# Patient Record
Sex: Female | Born: 1955 | Race: White | Hispanic: No | Marital: Married | Smoking: Never smoker
Health system: Southern US, Academic
[De-identification: ages and names within clinical notes are randomized; demographics above are authoritative.]

## PROBLEM LIST (undated history)

## (undated) DIAGNOSIS — E785 Hyperlipidemia, unspecified: Secondary | ICD-10-CM

## (undated) DIAGNOSIS — R6889 Other general symptoms and signs: Secondary | ICD-10-CM

## (undated) DIAGNOSIS — Z87898 Personal history of other specified conditions: Secondary | ICD-10-CM

## (undated) DIAGNOSIS — G4730 Sleep apnea, unspecified: Secondary | ICD-10-CM

## (undated) DIAGNOSIS — C801 Malignant (primary) neoplasm, unspecified: Secondary | ICD-10-CM

## (undated) DIAGNOSIS — F32A Depression, unspecified: Secondary | ICD-10-CM

## (undated) DIAGNOSIS — R7303 Prediabetes: Secondary | ICD-10-CM

## (undated) DIAGNOSIS — Z9989 Dependence on other enabling machines and devices: Secondary | ICD-10-CM

## (undated) DIAGNOSIS — G629 Polyneuropathy, unspecified: Secondary | ICD-10-CM

## (undated) DIAGNOSIS — F419 Anxiety disorder, unspecified: Secondary | ICD-10-CM

## (undated) DIAGNOSIS — M8580 Other specified disorders of bone density and structure, unspecified site: Secondary | ICD-10-CM

## (undated) DIAGNOSIS — M539 Dorsopathy, unspecified: Secondary | ICD-10-CM

## (undated) DIAGNOSIS — M797 Fibromyalgia: Secondary | ICD-10-CM

## (undated) DIAGNOSIS — G4700 Insomnia, unspecified: Secondary | ICD-10-CM

## (undated) DIAGNOSIS — R519 Headache, unspecified: Secondary | ICD-10-CM

## (undated) DIAGNOSIS — M1990 Unspecified osteoarthritis, unspecified site: Secondary | ICD-10-CM

## (undated) DIAGNOSIS — K219 Gastro-esophageal reflux disease without esophagitis: Secondary | ICD-10-CM

## (undated) DIAGNOSIS — Z8679 Personal history of other diseases of the circulatory system: Secondary | ICD-10-CM

## (undated) HISTORY — PX: KNEE CARTILAGE SURGERY: SHX688

## (undated) HISTORY — PX: HX SHOULDER SURGERY: 2100001311

## (undated) HISTORY — PX: BREAST CYST EXCISION: SHX579

## (undated) HISTORY — PX: HX TONSILLECTOMY: SHX27

## (undated) HISTORY — PX: REPLACEMENT TOTAL KNEE: SUR1224

## (undated) HISTORY — PX: HX BACK SURGERY: SHX140

## (undated) HISTORY — PX: SKIN CANCER EXCISION: SHX779

---
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
## 1995-11-24 ENCOUNTER — Other Ambulatory Visit (HOSPITAL_COMMUNITY): Payer: Self-pay

## 2020-07-25 IMAGING — CR XRAY SACRUM/COCCYX MIN 2 VIEWS
1 series · 2 of 2 positions shown · non-contrast
Comparison: None available.

﻿EXAM:  XRAY PELVIS 1/2 VIEWS

EXAM: XRAY SACRUM/COCCYX MIN 2 VIEWS
INDICATION: Trauma. Fall.

[Series 1: view not recorded · 0.17mm/px · 2 of 2 slices shown]
[im 1/2]
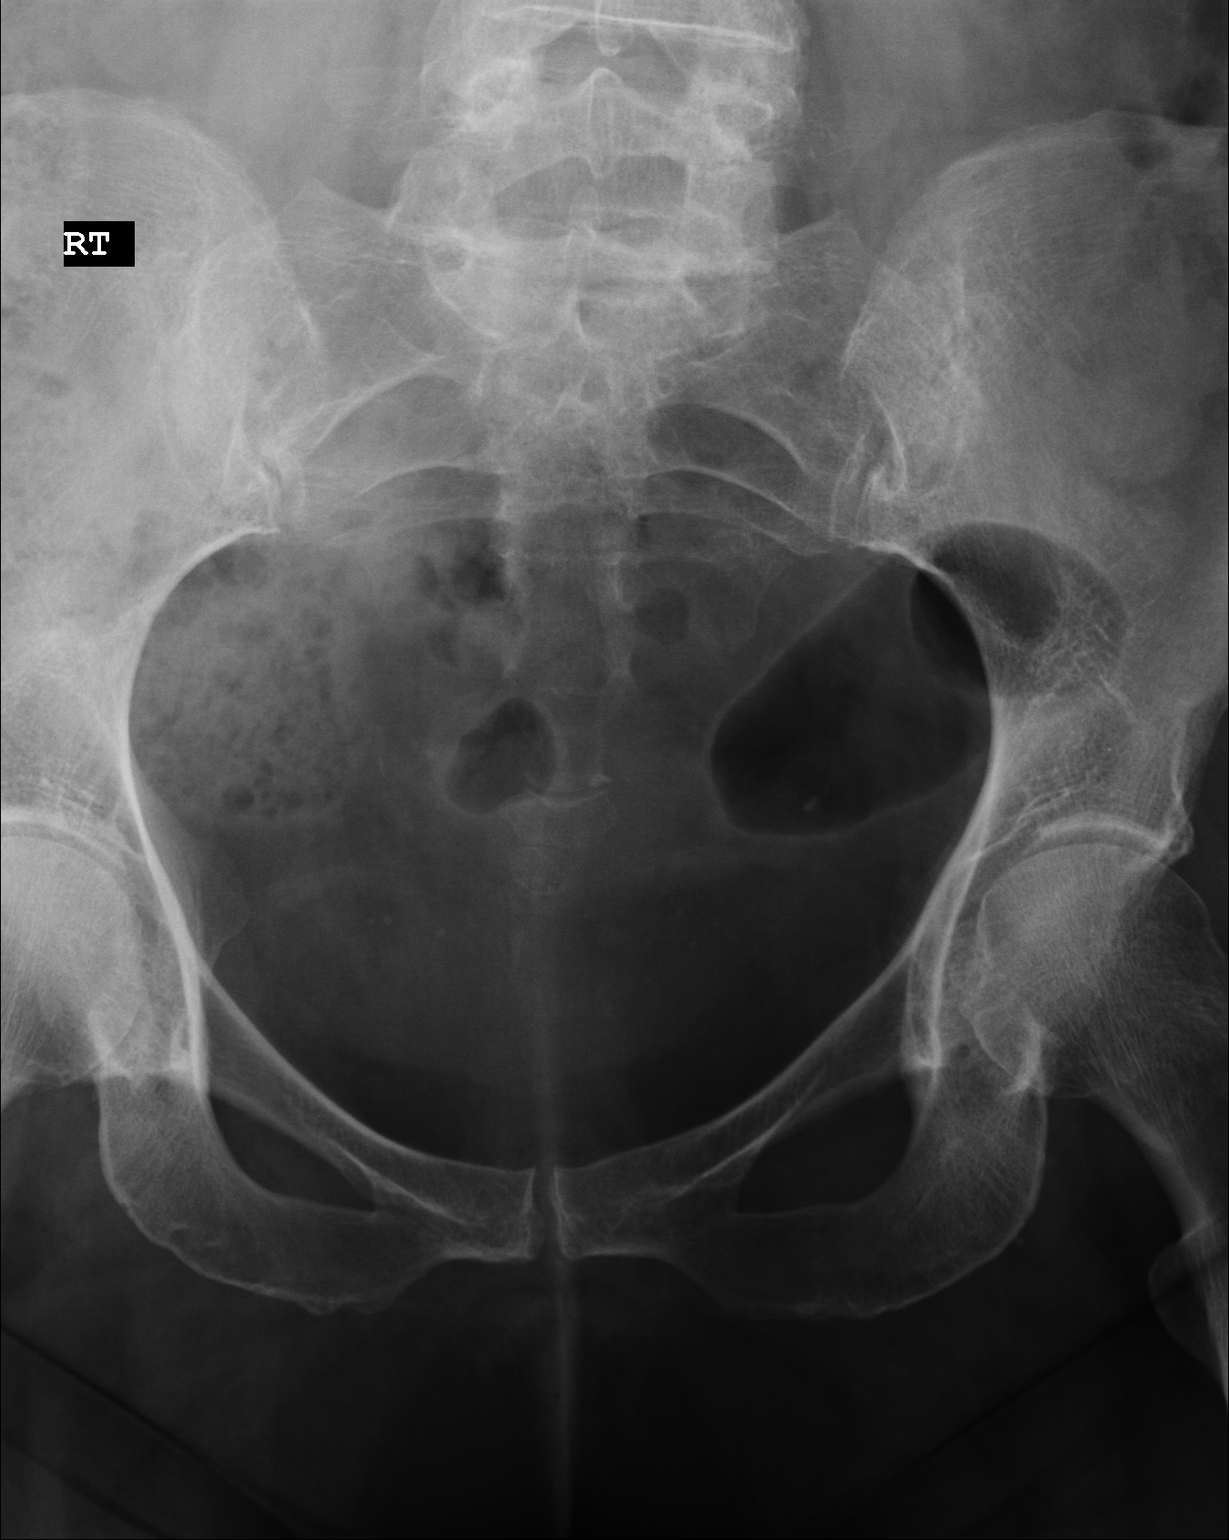
[im 2/2]
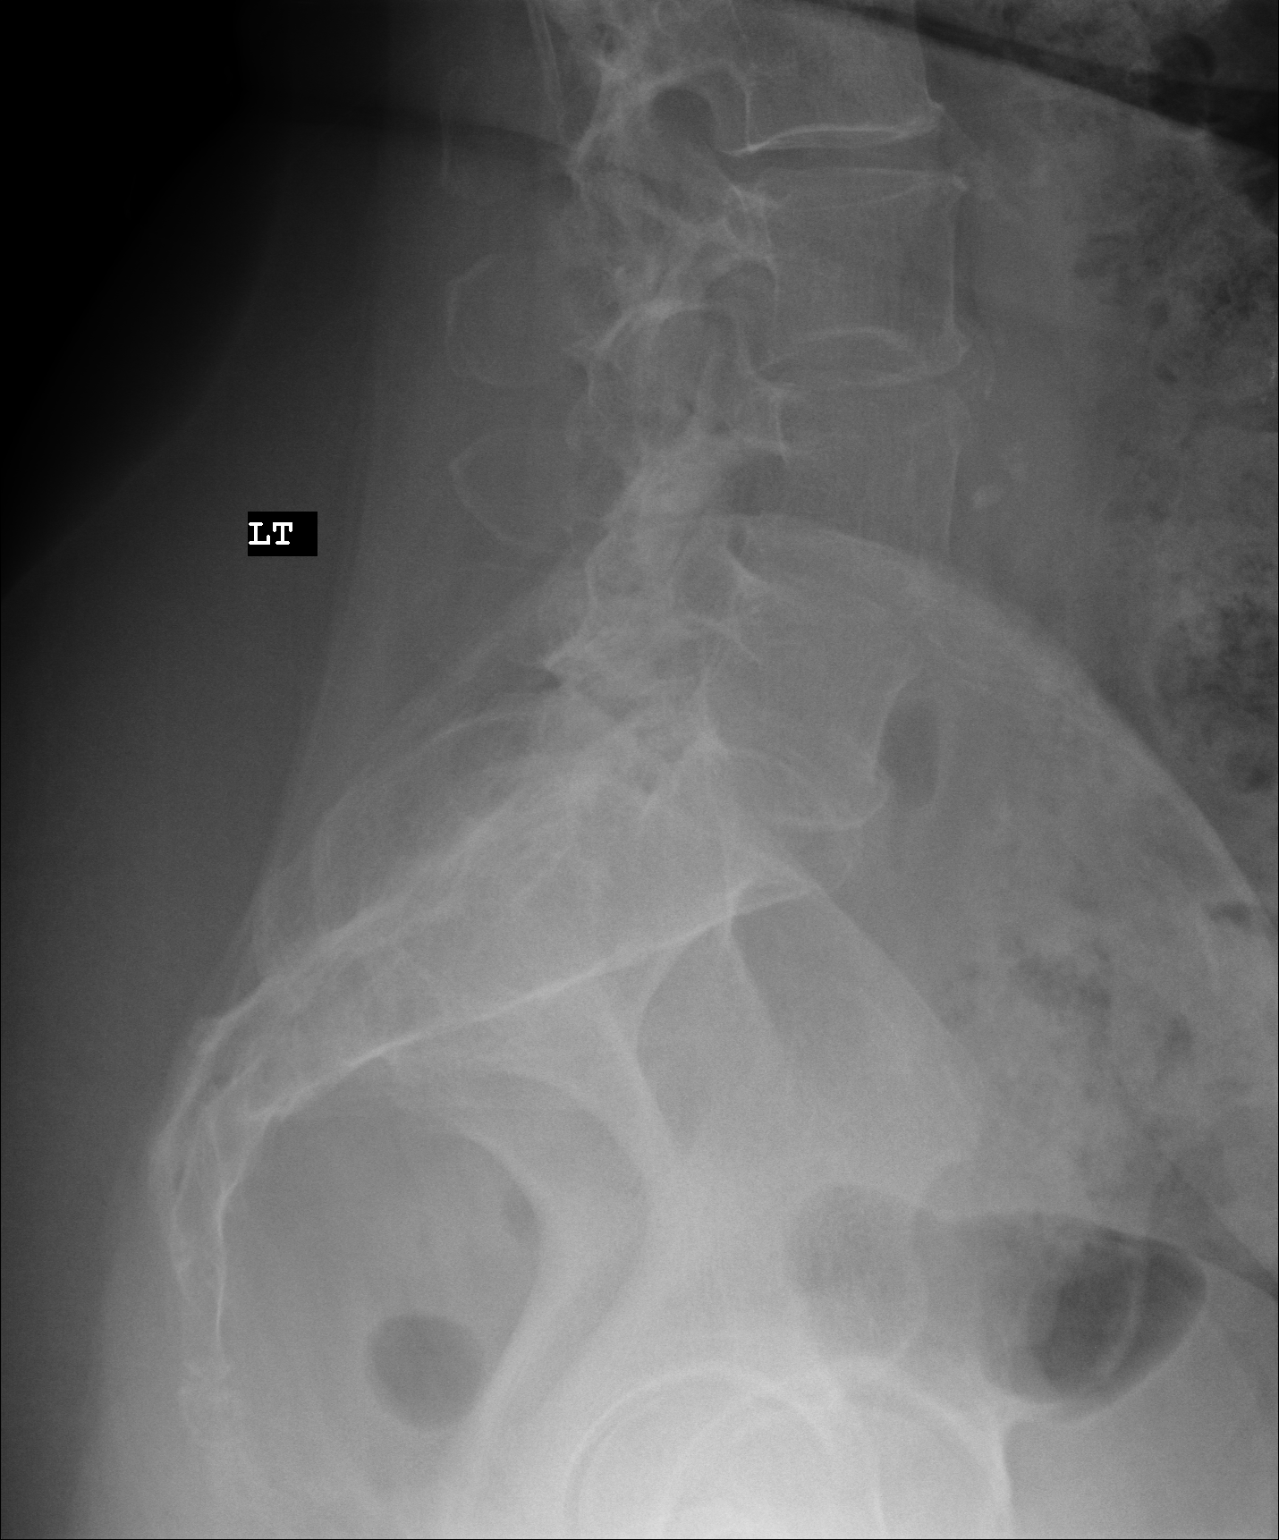

[2 of 2 positions shown; findings below may reference images not displayed]

FINDINGS: There is no acute fracture or subluxation. Hip and sacroiliac joints appear well maintained. No suspicious lesion of the sacrum is seen. Surrounding soft tissues are also unremarkable.
IMPRESSION: Unremarkable exams.

## 2020-07-25 IMAGING — CR XRAY PELVIS [DATE] VIEWS
1 series · 1 of 1 positions shown · non-contrast
Comparison: None available.

﻿EXAM:  XRAY PELVIS 1/2 VIEWS

EXAM: XRAY SACRUM/COCCYX MIN 2 VIEWS
INDICATION: Trauma. Fall.

[view not recorded]
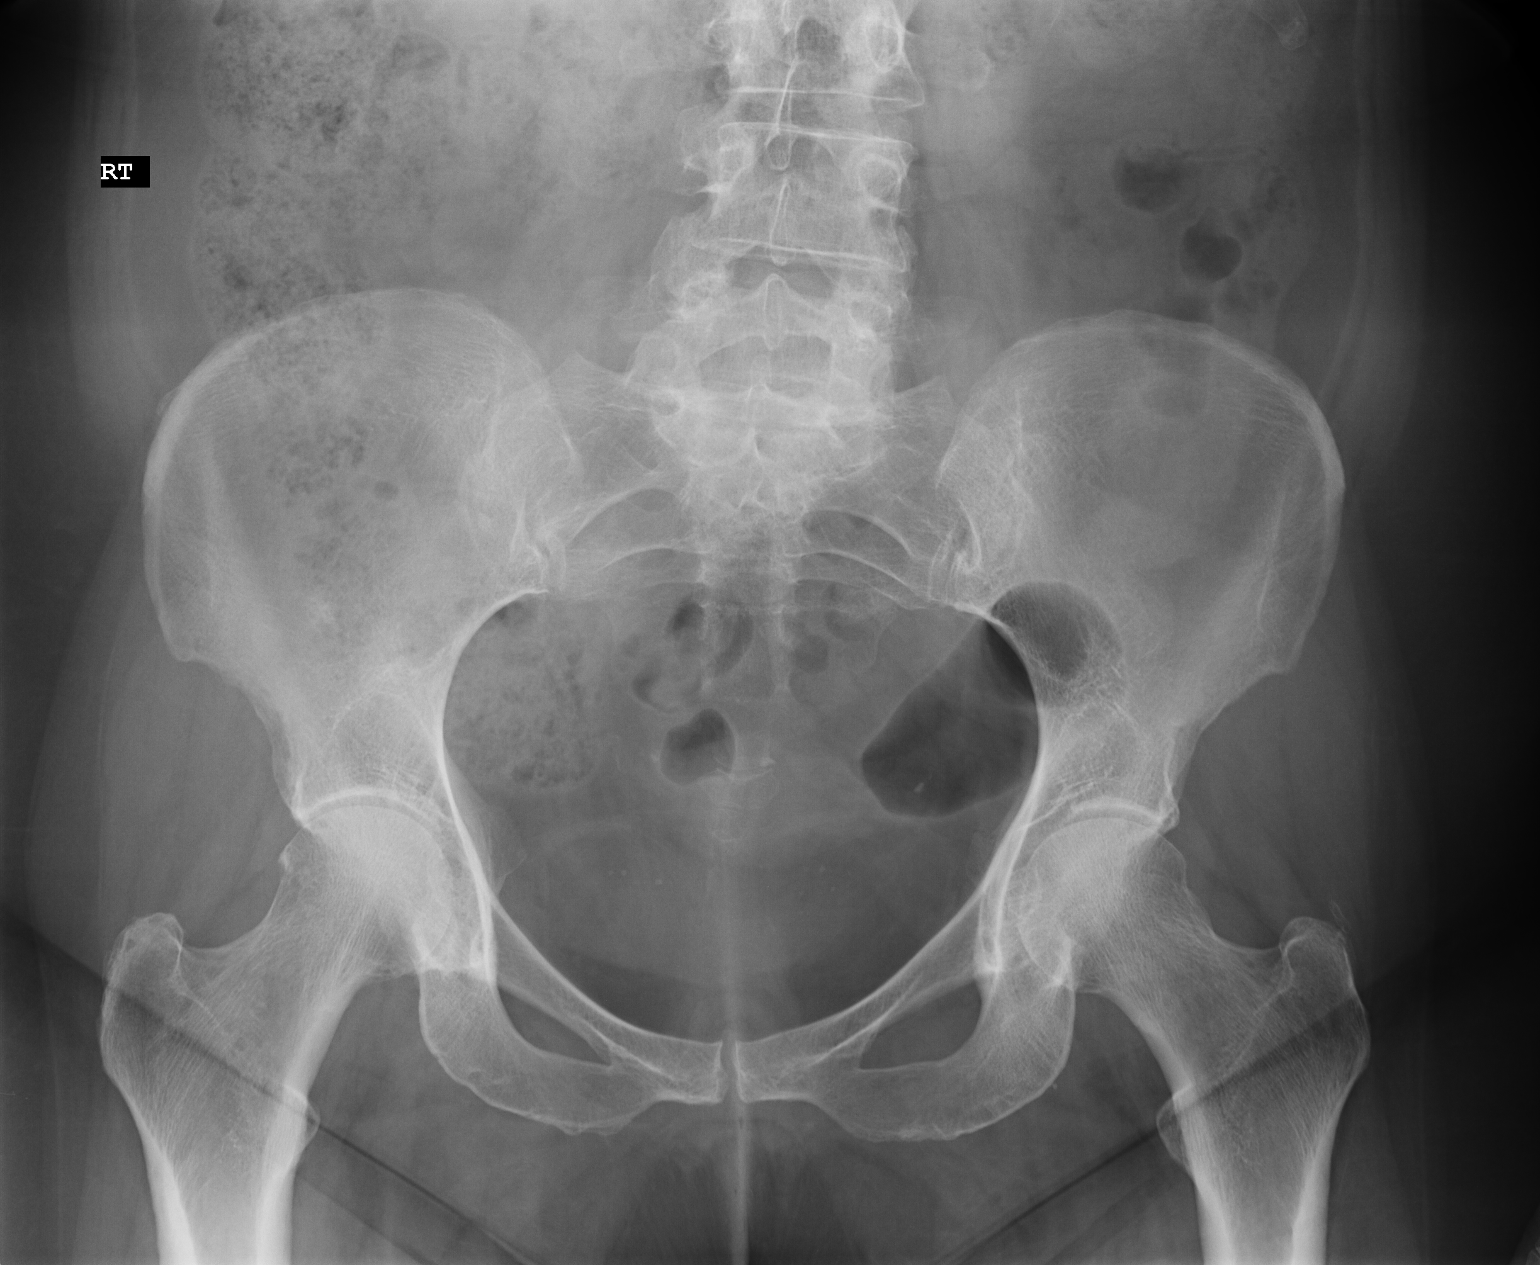

[1 of 1 positions shown; findings below may reference images not displayed]

FINDINGS: There is no acute fracture or subluxation. Hip and sacroiliac joints appear well maintained. No suspicious lesion of the sacrum is seen. Surrounding soft tissues are also unremarkable.
IMPRESSION: Unremarkable exams.

## 2020-07-25 IMAGING — CR XRAY LUMBAR SPINE COMPLETE
1 series · 5 of 5 positions shown · non-contrast
Comparison: None available.

﻿EXAM:  XRAY LUMBAR SPINE COMPLETE
INDICATION: Lower back pain.

[Series 1: view not recorded · 0.17mm/px · 5 of 5 slices shown]
[im 1/5]
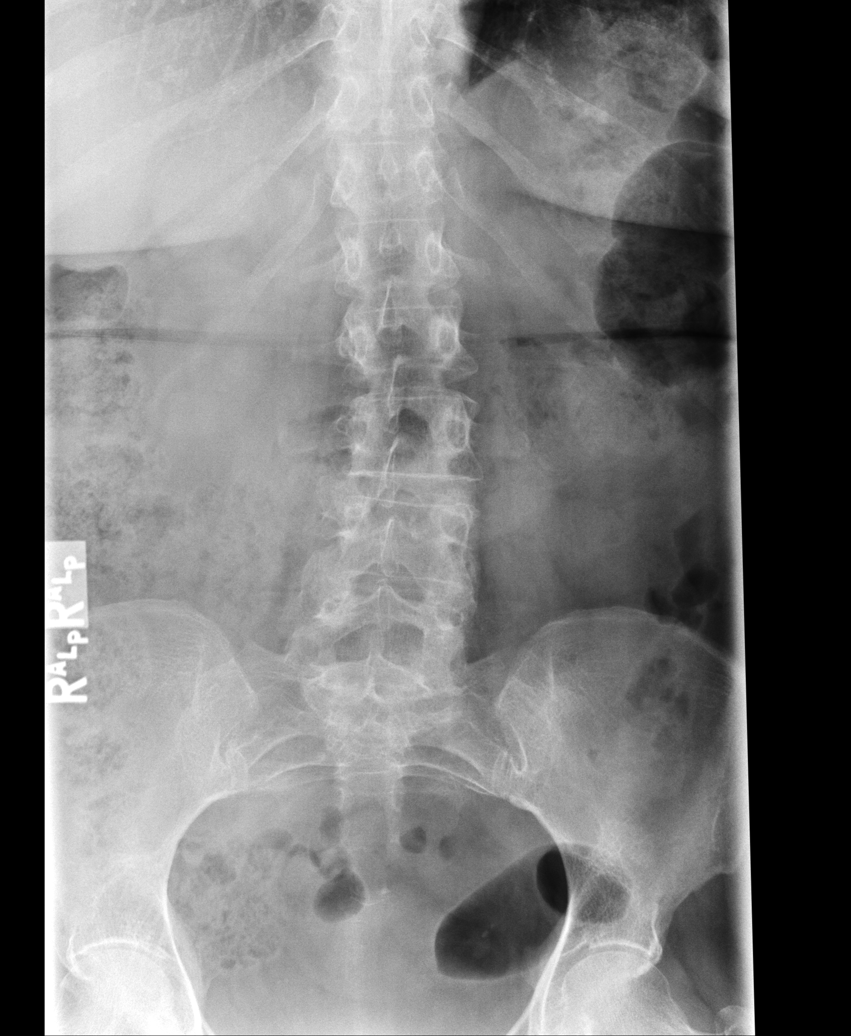
[im 2/5]
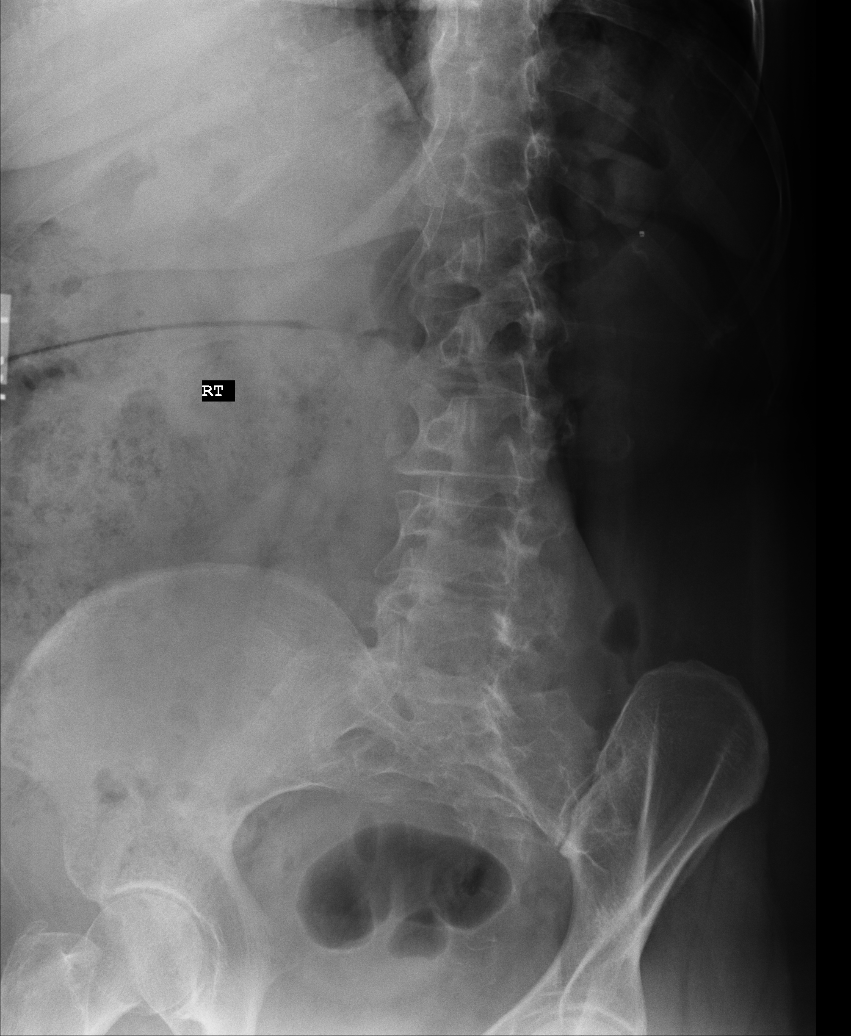
[im 3/5]
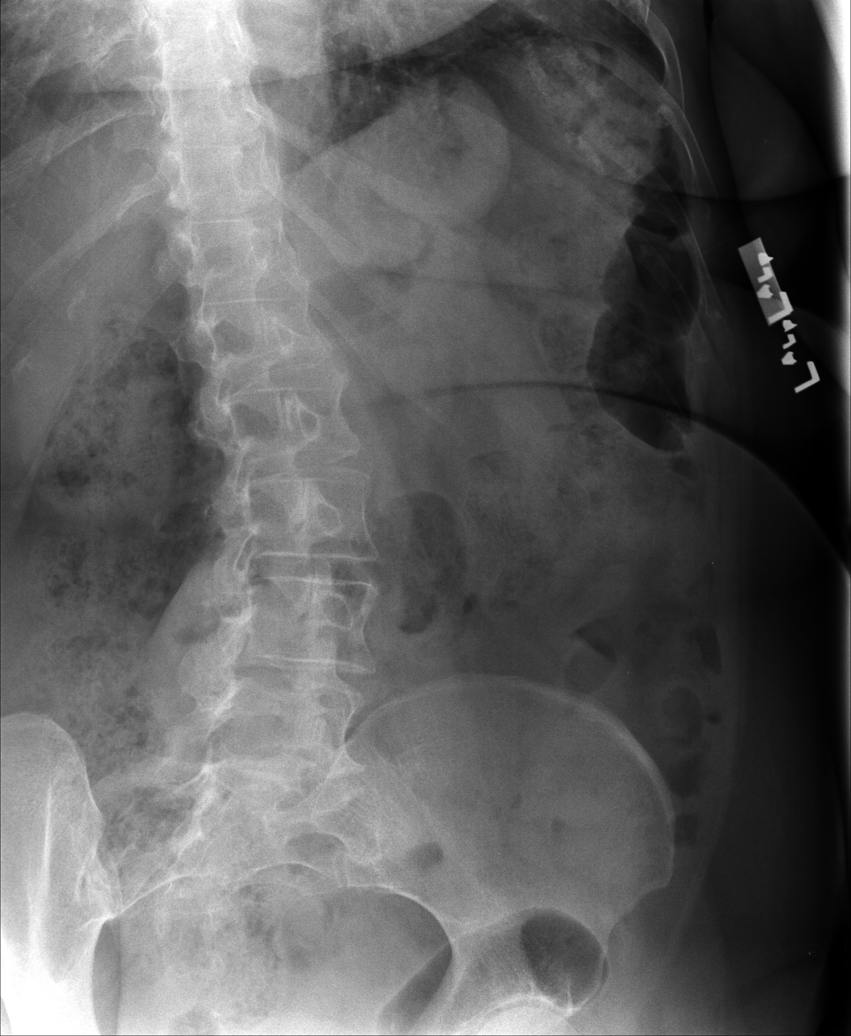
[im 4/5]
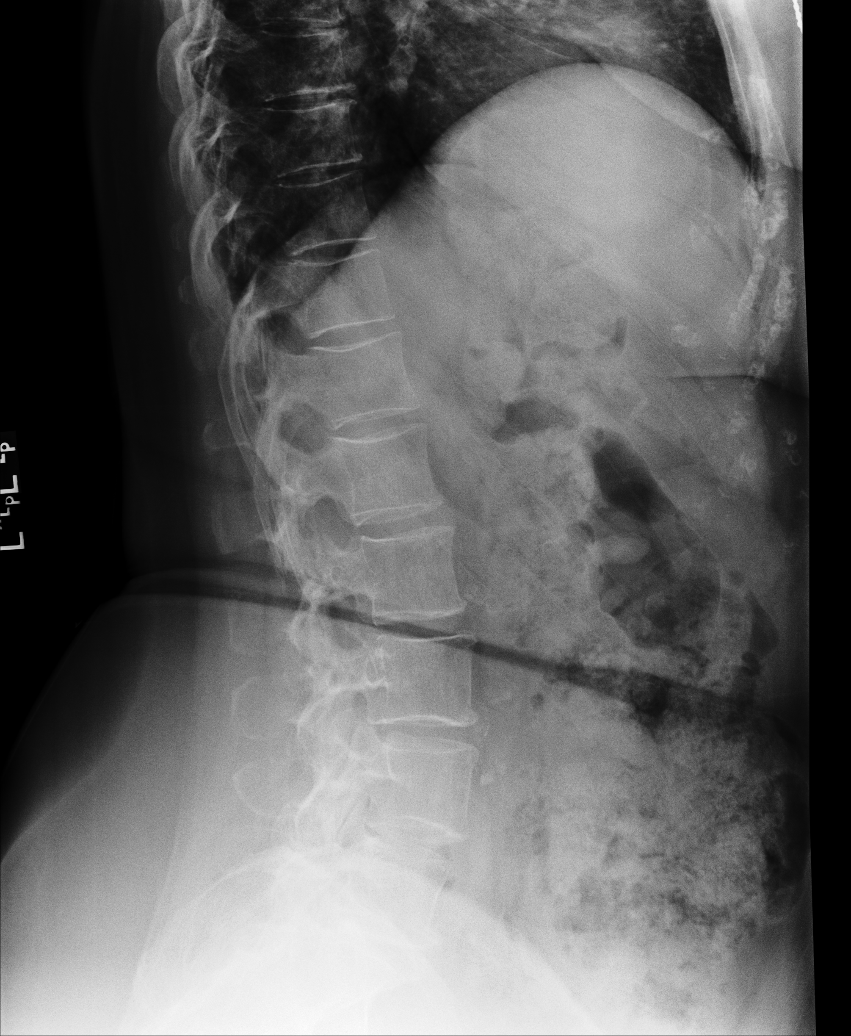
[im 5/5]
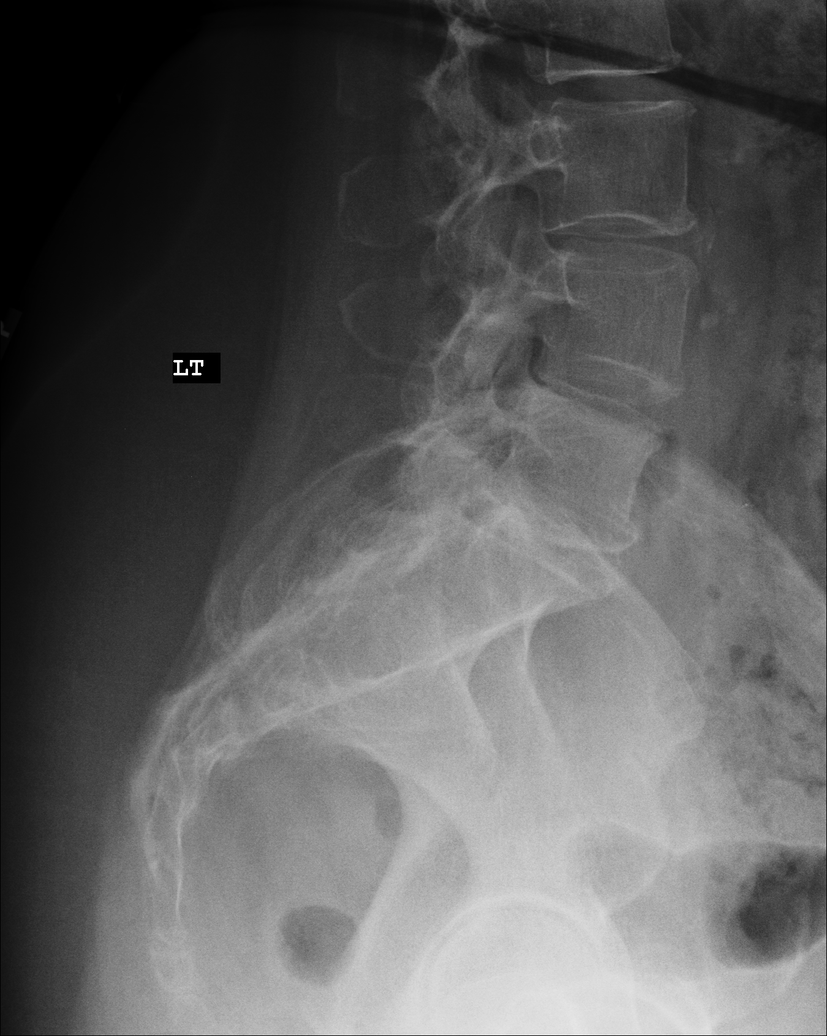

[5 of 5 positions shown; findings below may reference images not displayed]

FINDINGS: Vertebral bodies are normal in height and alignment. There is no acute fracture or subluxation. There is mild degenerative disc disease at L5-S1 level. There is moderate facet arthropathy within the lower lumbar spine. There is no definite pars defect on oblique views. Paraspinal soft tissues are unremarkable. There are minimal vascular calcifications.
IMPRESSION: Degenerative changes as detailed above.

## 2022-01-15 IMAGING — MR MRI CERVICAL SPINE WITHOUT CONTRAST
4 of 5 series · 22 of 48 positions shown · non-contrast
Comparison: No prior imaging studies of cervical spine are available for comparison.

﻿EXAM:  [id number]   MRI CERVICAL SPINE WITHOUT CONTRAST
INDICATION: 65-year-old with persistent neck pain.  No history of cervical spine surgery.
TECHNIQUE: Sagittal, coronal and axial images of the cervical spine were obtained following standard protocol.

[Series 5: T2 · sagittal · 3.0mm · 0.78mm/px · 7 of 11 slices shown (1 of 2)]
[im 1/11]
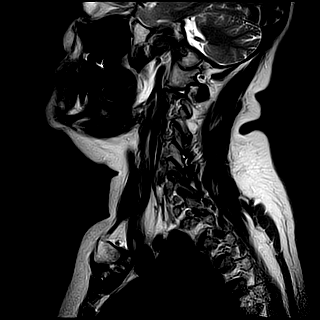
[im 2/11]
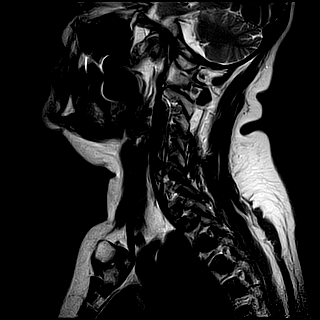
[im 4/11]
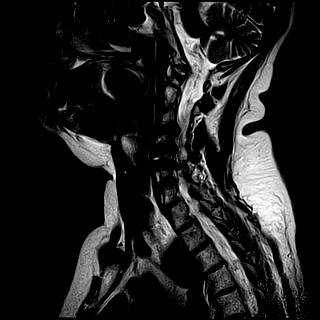
[im 6/11]
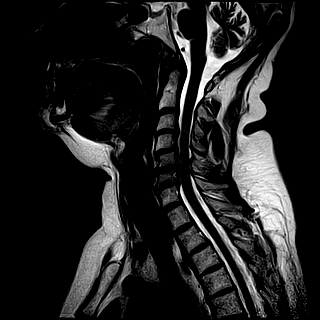
[im 7/11]
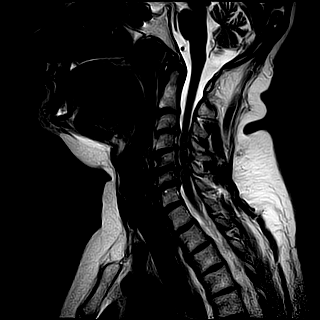
[im 9/11]
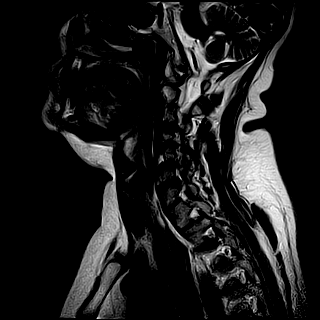
[im 11/11]
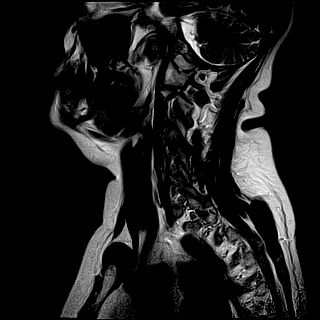

[Series 6: T1 · sagittal · 3.0mm · 0.49mm/px · 3 of 11 slices shown]
[im 2/11]
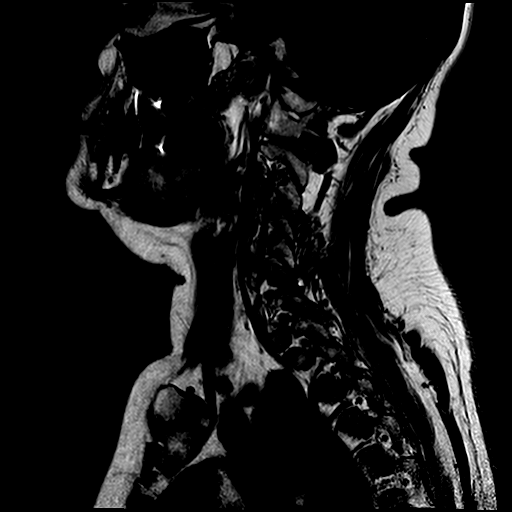
[im 6/11]
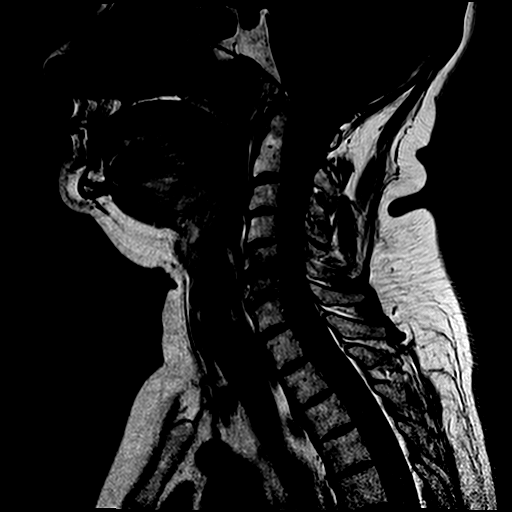
[im 9/11]
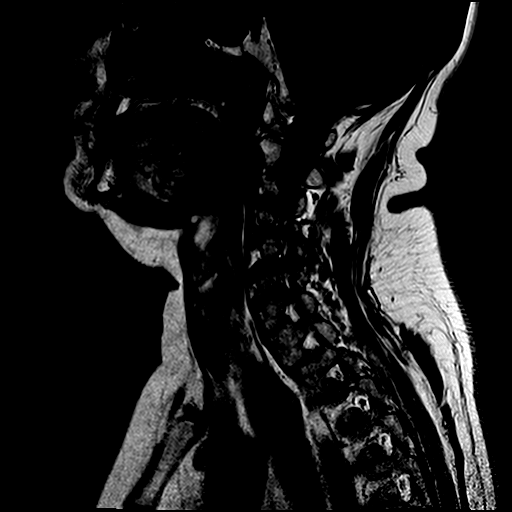

[Series 7: STIR · sagittal · 3.0mm · 0.49mm/px · 3 of 11 slices shown]
[im 2/11]
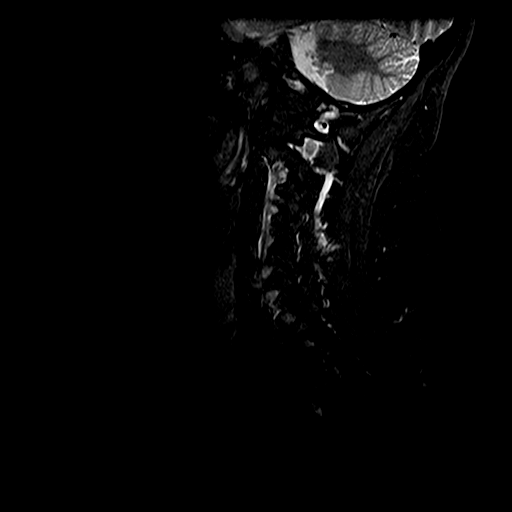
[im 6/11]
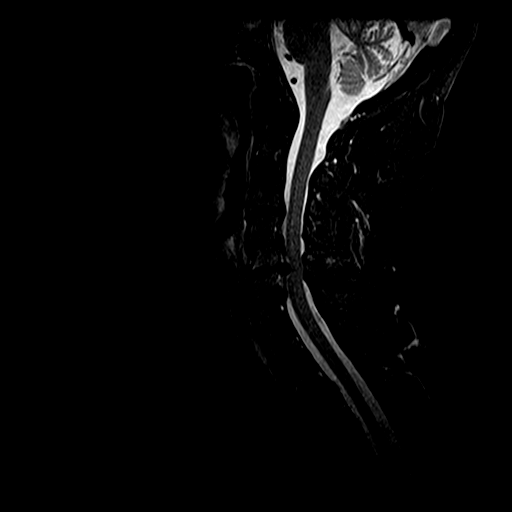
[im 9/11]
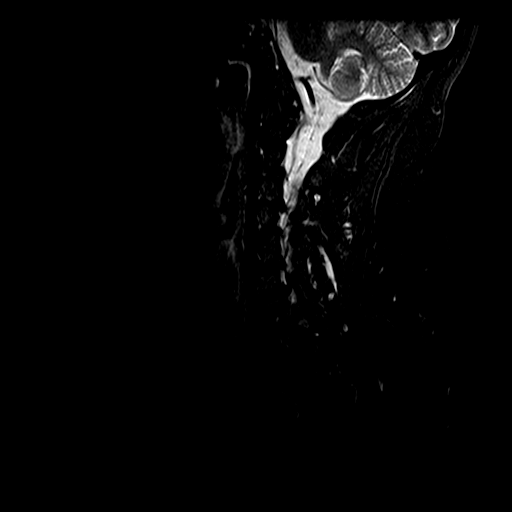

[Series 9: T2 · axial · 3.5mm · 0.31mm/px · z∈[-65,+37]mm · 9 of 18 slices shown (2 of 2)]
[im 1/18]
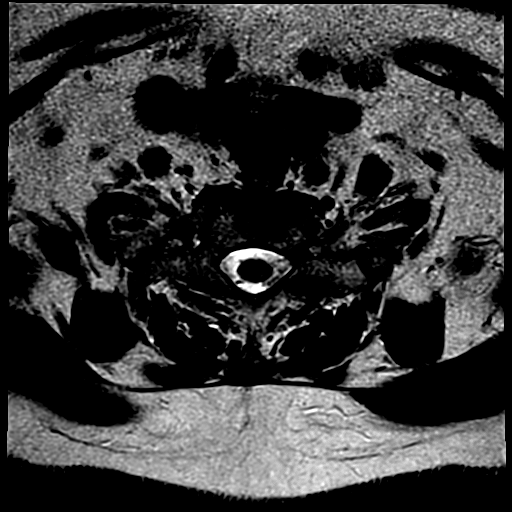
[im 3/18]
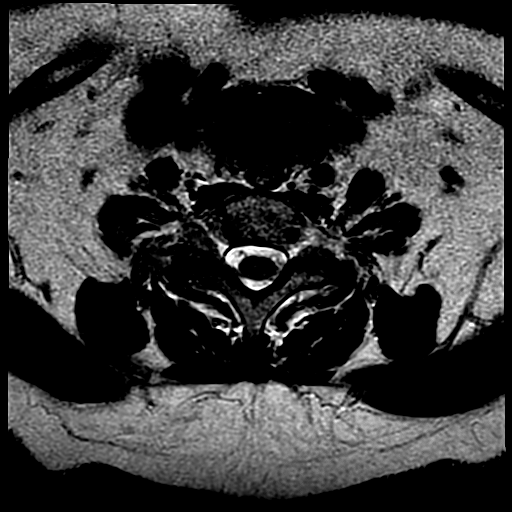
[im 6/18]
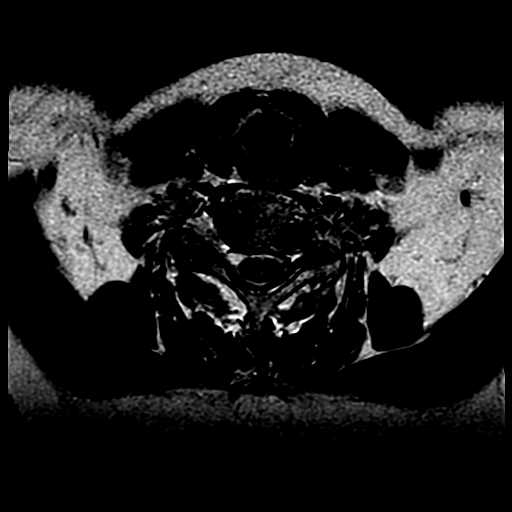
[im 8/18]
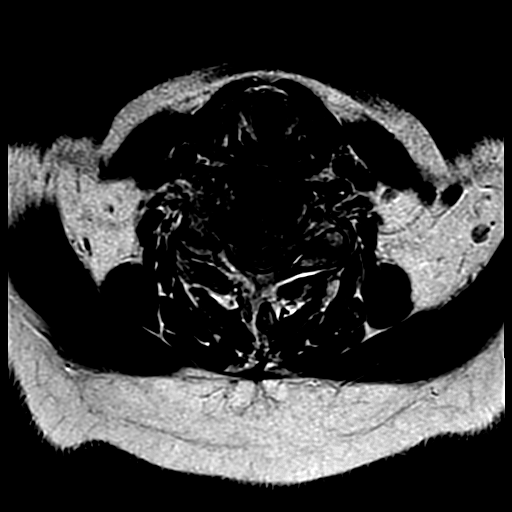
[im 9/18]
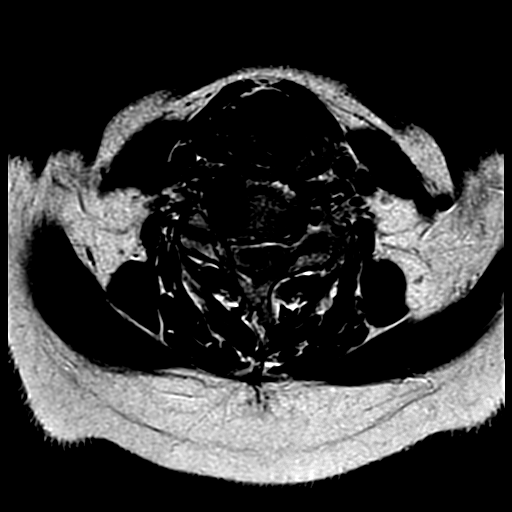
[im 10/18]
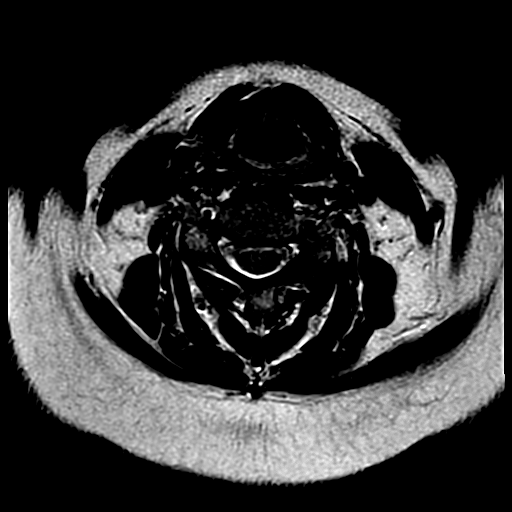
[im 12/18]
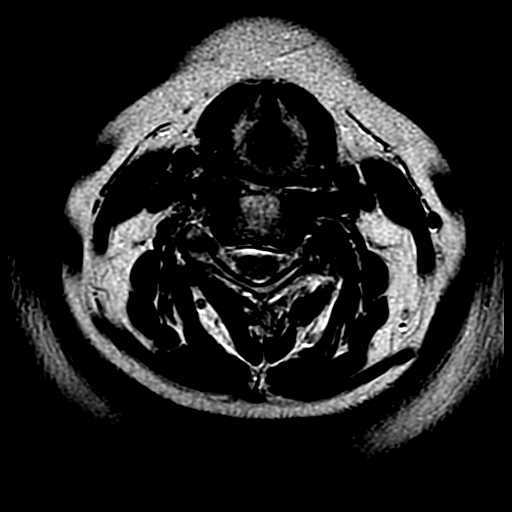
[im 15/18]
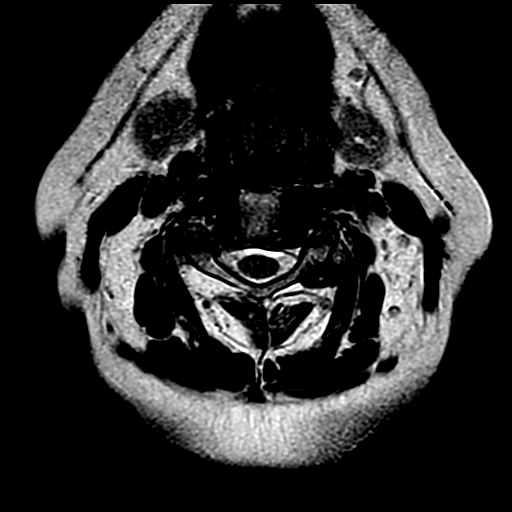
[im 18/18]
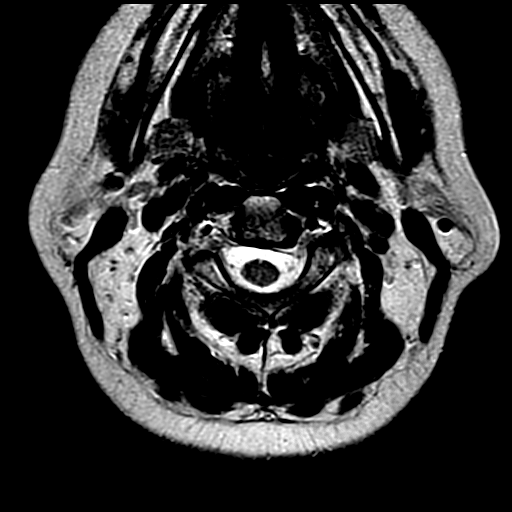

[22 of 48 positions shown; findings below may reference images not displayed]

FINDINGS: No acute bone changes of cervical vertebrae are seen.  

Posterior fossa and foramen magnum structures are normal in the sagittal projection. 

At C2-C3 level, degenerative disc osteophyte complex is causing mild compromise of left neural foramen.  

At C3-C4 level, degenerative disc changes with osteophyte complex are causing moderately significant bilateral compromise of neural foramina.  AP diameter of thecal sac in the midline at C3-C4 level measures 8.9 mm.  

At C4-C5 level, degenerative disc osteophyte complex is causing significant compromise of right neural foramen.  AP diameter of thecal sac in the midline at C4-C5 level measures 7.6 mm.  

At C5-C6 level, severe degenerative disc disease with large osteophyte complex is noted impinging on the thecal sac, predominantly to the right of the midline and causing severe compromise of right lateral recess and right neural foramen and impinging on the cervical spinal cord and multiple nerve roots on the right side at C5-C6 level.  AP diameter of the thecal sac in the midline at C5-C6 level measures 5.4 mm.  

At C6-C7 level, prominent osteophyte complex is causing severe compromise of left lateral recess and the left neural foramen.  

C7-T1 disc is normal.  

Cervical spinal cord shows no evidence of cord edema although there is significant impingement on the cervical spinal cord to the right of the midline at C5-C6 level by large osteophyte complex.  No evidence of syrinx cavity is noted.  

Paravertebral soft tissues are unremarkable.
IMPRESSION: 1. No acute bony lesions of cervical vertebrae.  

2. At C5-C6 level, severe degenerative disc disease with large osteophyte complex is noted impinging on the thecal sac, predominantly to the right of the midline and causing severe compromise of right lateral recess and right neural foramen and impinging on the cervical spinal cord and multiple nerve roots on the right side at C5-C6 level.  AP diameter of the thecal sac in the midline at C5-C6 level measures 5.4 mm.  

3. At C6-C7 level, prominent osteophyte complex is causing severe compromise of left lateral recess and the left neural foramen.  

4. Findings at other disc levels particularly C3-C4 and C4-C5 levels are described above in detail.  

5. No focal lesions of the cervical cord are seen although there is significant compromise of the thecal sac and impinging on the cervical spinal cord to the right of the midline at C5-C6 level by the large disc osteophyte complex.

## 2022-01-15 IMAGING — MR MRI LUMBAR SPINE WITHOUT CONTRAST
6 series · 41 of 48 positions shown · non-contrast
Comparison: Lumbar spine x-ray dated 07/25/2020.

﻿EXAM:  [id number]   MRI LUMBAR SPINE WITHOUT CONTRAST
INDICATION: 65-year-old with persistent low back pain.  Patient had lumbar disc surgery at L5-S1 level in the past.  Exact nature of the surgery is not available.
TECHNIQUE: Axial, coronal and sagittal images as per protocol.

[Series 5: T2 · sagittal · 4.0mm · 0.94mm/px · 5 of 13 slices shown (1 of 3)]
[im 1/13]
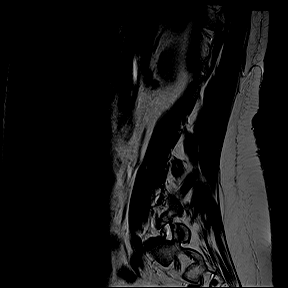
[im 4/13]
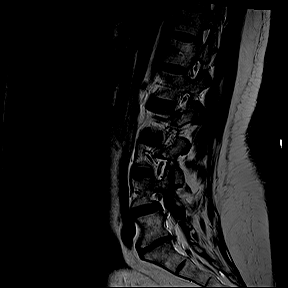
[im 7/13]
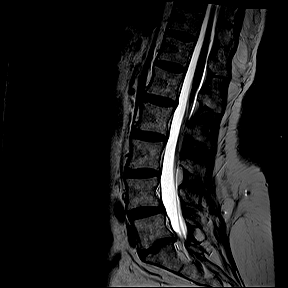
[im 10/13]
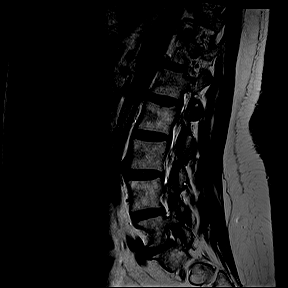
[im 13/13]
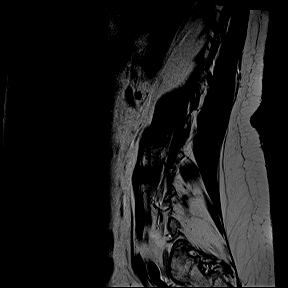

[Series 6: T1 · sagittal · 4.0mm · 0.94mm/px · 6 of 13 slices shown (1 of 2)]
[im 1/13]
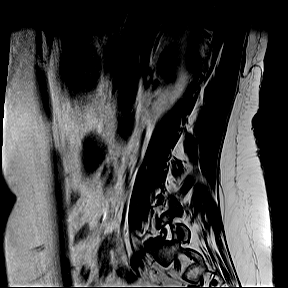
[im 3/13]
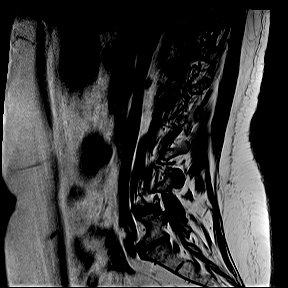
[im 5/13]
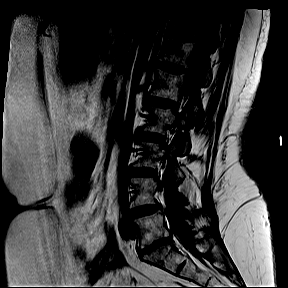
[im 8/13]
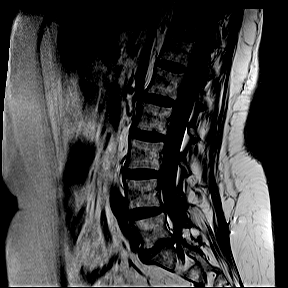
[im 10/13]
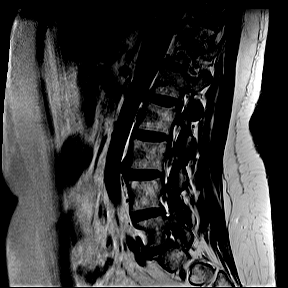
[im 13/13]
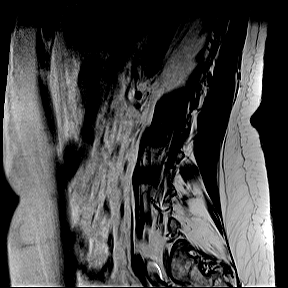

[Series 8: STIR · sagittal · 4.0mm · 1.05mm/px · 2 of 13 slices shown]
[im 1/13]
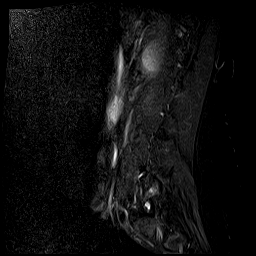
[im 3/13]
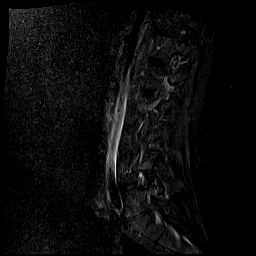

[Series 9: T2 · axial · 4.0mm · 0.47mm/px · z∈[-79,+98]mm · 11 of 23 slices shown (2 of 3)]
[im 1/23]
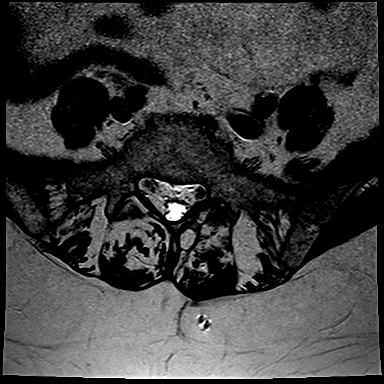
[im 3/23]
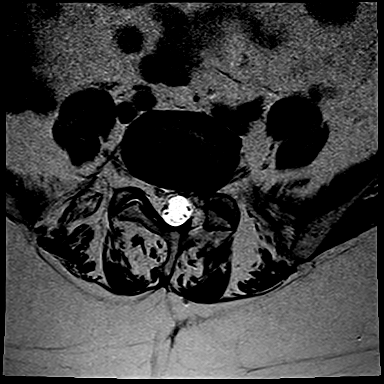
[im 5/23]
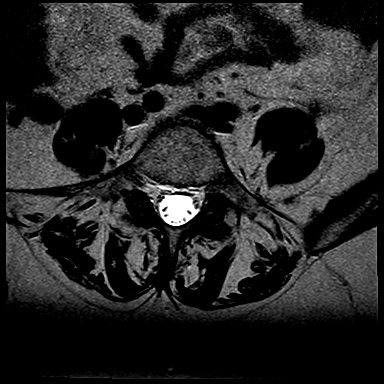
[im 7/23]
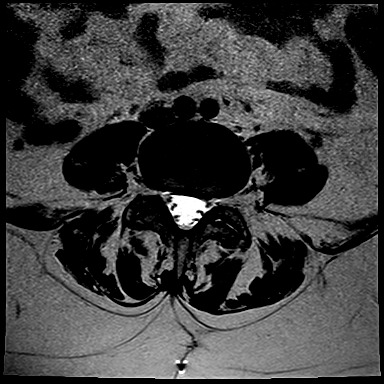
[im 9/23]
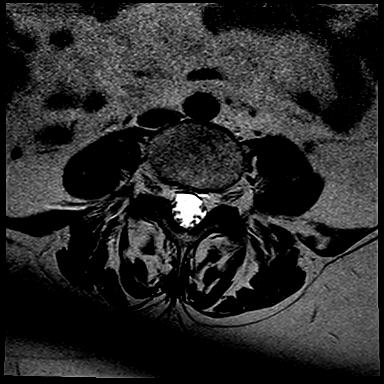
[im 12/23]
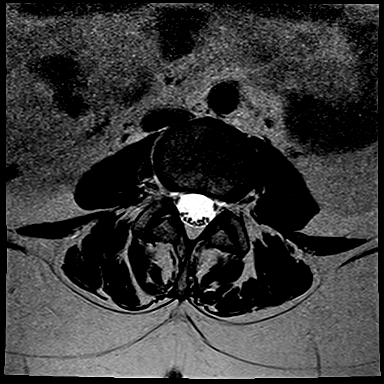
[im 14/23]
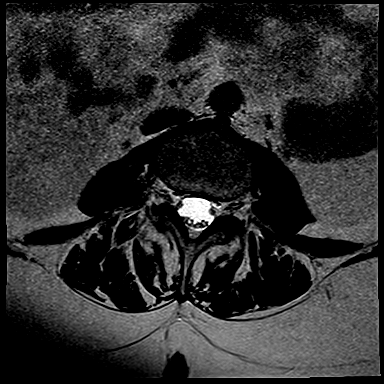
[im 16/23]
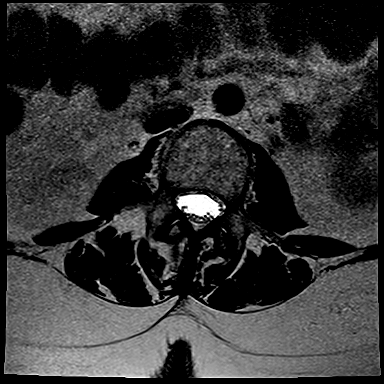
[im 18/23]
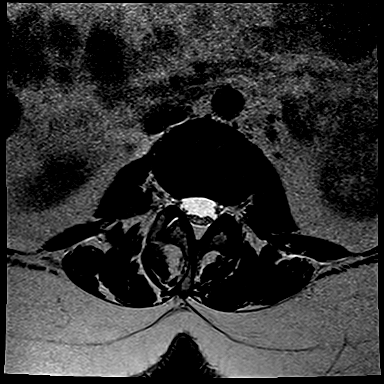
[im 20/23]
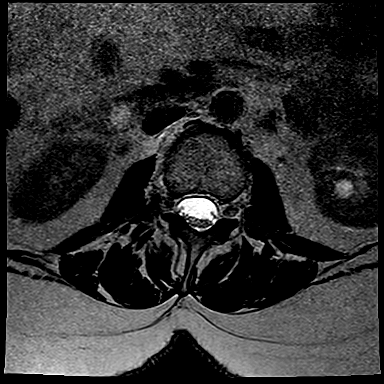
[im 23/23]
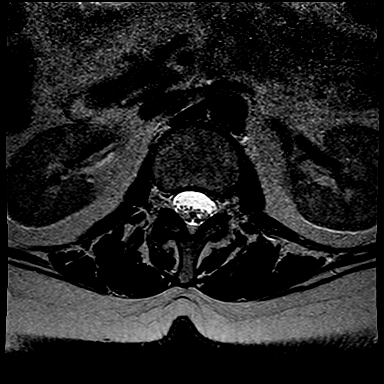

[Series 10: T1 · axial · 4.0mm · 0.47mm/px · z∈[-79,+98]mm · 8 of 23 slices shown (2 of 2)]
[im 1/23]
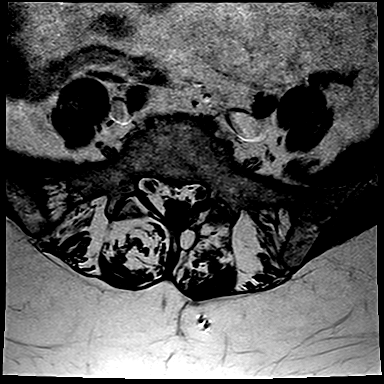
[im 5/23]
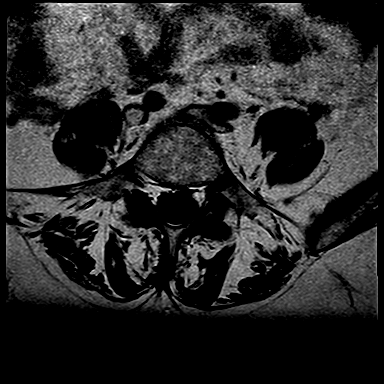
[im 7/23]
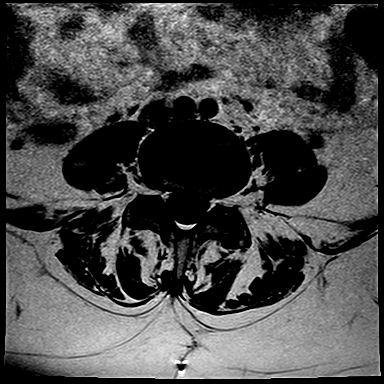
[im 9/23]
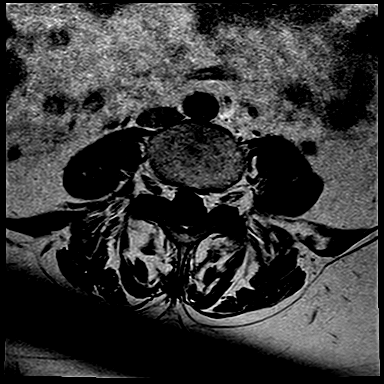
[im 14/23]
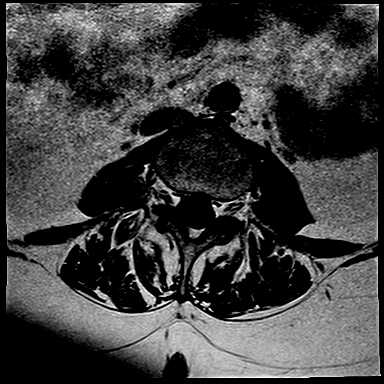
[im 16/23]
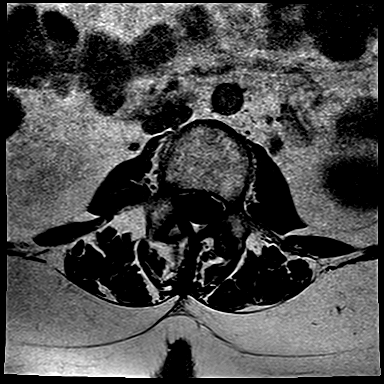
[im 18/23]
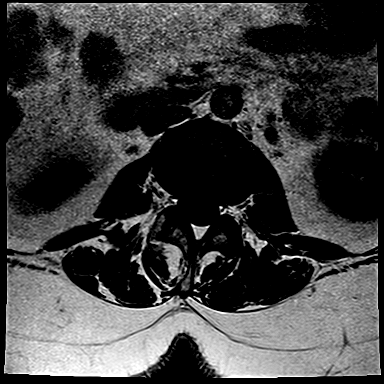
[im 23/23]
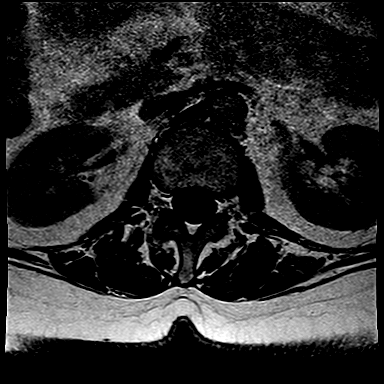

[Series 11: T2 · coronal · 4.0mm · 1.22mm/px · 9 of 20 slices shown (3 of 3)]
[im 1/20]
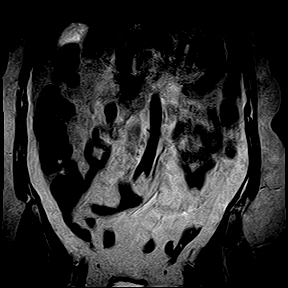
[im 3/20]
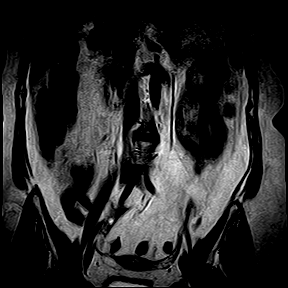
[im 5/20]
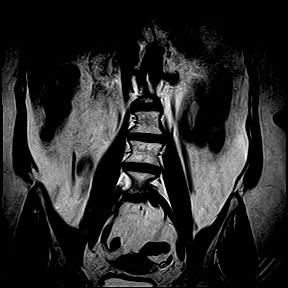
[im 8/20]
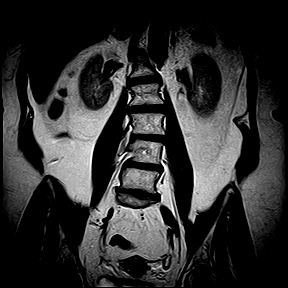
[im 10/20]
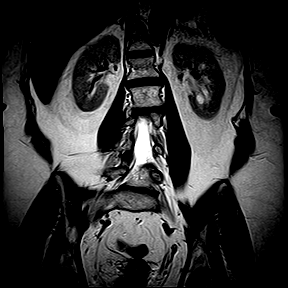
[im 12/20]
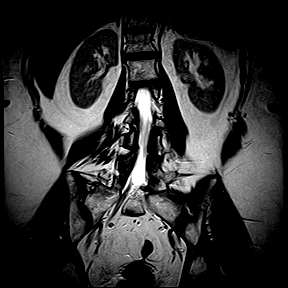
[im 15/20]
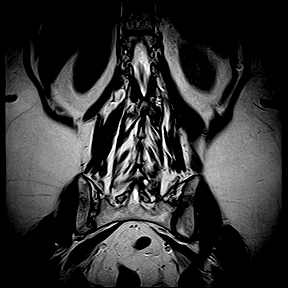
[im 17/20]
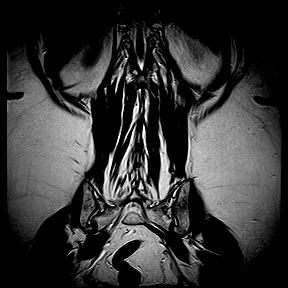
[im 20/20]
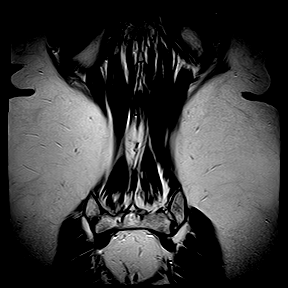

[41 of 48 positions shown; findings below may reference images not displayed]

FINDINGS: No acute bony lesions of lumbar vertebrae.  Lower spinal cord and cauda equina are unremarkable in the sagittal projection. 

T12-L1 disc is normal.  

At L1-L2 level, minimal focal annulus protrusion on the left side at the lateral recess level is causing mild compromise of the left lateral recess and the left neural foramen at L1-L2 level.

At L2-L3 level, facet arthropathy and degenerative disc changes are causing mild compromise of both left recesses and neural foramina. 

At L3-L4 level, no focal disc lesions are seen. 

At L4-L5 level, bulging annulus and facet arthropathy are causing mild compromise of both lateral recesses and neural foramina.  

At L5-S1 level, significant degenerative disc changes with loss of disc space with postsurgical changes of dorsal hemilaminectomy on the left side.  Apparent postsurgical scarring of the left lateral recess is noted.  No definite evidence of recurrent disc herniation is seen.  Asymmetric bulging annulus on the left side with facet arthropathy is causing significant left foraminal stenosis at L5-S1 level impinging on the left L5 nerve root.  

Paravertebral soft tissues are unremarkable.
IMPRESSION: 1. Evaluation is somewhat limited due to lack of contrast to distinguish between epidural scar tissue from prior surgery and with any evidence of disc herniation that may be recurrent.

2. At L5-S1 level, significant degenerative disc changes with loss of disc space with postsurgical changes of dorsal hemilaminectomy on the left side.  Apparent postsurgical scarring of the left lateral recess is noted.  No definite evidence of recurrent disc herniation is seen.  Asymmetric bulging annulus on the left side with facet arthropathy is causing significant left foraminal stenosis at L5-S1 level impinging on the left L5 nerve root.  

3. Findings at other disc levels are described above in detail.

## 2022-05-17 ENCOUNTER — Emergency Department
Admission: EM | Admit: 2022-05-17 | Discharge: 2022-05-17 | Disposition: A | Discharge status: Home / Self Care | Payer: Medicare Other | Attending: Physician Assistant | Admitting: Physician Assistant

## 2022-05-17 ENCOUNTER — Emergency Department (HOSPITAL_COMMUNITY): Payer: Medicare Other

## 2022-05-17 ENCOUNTER — Other Ambulatory Visit: Payer: Self-pay

## 2022-05-17 DIAGNOSIS — M7989 Other specified soft tissue disorders: Secondary | ICD-10-CM

## 2022-05-17 DIAGNOSIS — M79662 Pain in left lower leg: Secondary | ICD-10-CM

## 2022-05-17 NOTE — ED Triage Notes (Signed)
States red spot and slight bruising to side of left lower leg since yesterday.

## 2022-05-17 NOTE — ED Nurses Note (Signed)
Discharged home, received written discharge instructions with understanding voiced. Ambulating from ER without difficulty.

## 2022-05-17 NOTE — Discharge Instructions (Signed)
Continue home medications as directed by your PCP. Apply ice to the area in 15 minute intervals. Keep the area elevated as needed. Call your PCP to schedule a follow up appointment. If anything changes or gets worse return to the emergency department.

## 2022-05-17 NOTE — ED Provider Notes (Signed)
[facility]  Emergency Department  Primary Provider Note      CHIEF COMPLAINT  Chief Complaint   Patient presents with   . Leg Pain     HISTORY OF PRESENT ILLNESS  Ashley Reid, date of birth 08-12-1956, is a 66 y.o. female who presented to the Emergency Department with left medial lower leg tenderness that began yesterday. No known injury or inciting event.  She reports there is red spot with some bruising. She also reports her left foot tingling earlier today. She reports they recently traveled back home from [place].  She is a Nonsmoker, no estrogen use.  She denies any other complaints or concerns.      PAST MEDICAL/SURGICAL/FAMILY/SOCIAL HISTORY  No past medical history on file.     No past surgical history pertinent negatives on file.      Family Medical History:    None       Social History     Socioeconomic History   . Marital status: Married      ALLERGIES  Allergies   Allergen Reactions   . Penicillins Rash       PHYSICAL EXAM  VITAL SIGNS:  Filed Vitals:    05/17/22 1440 05/17/22 1608   BP: (!) 140/84 110/76   Pulse: 73 64   Resp: 19 18   Temp: 36.7 C (98 F) 37.1 C (98.8 F)   SpO2: 98% 97%       General: No distress.   HENT:  Head: Normocephalic and atraumatic.  Mouth/Throat: Oropharynx is clear and moist. Uvula midline.   Eyes: EOMI, PERRL. normal conjunctiva without injection, lids without swelling or exudate  Ears: Hearing grossly intact.   Neck: Trachea midline. Neck supple.  Cardiovascular: RRR.    Respiratory: CTAB, normal work of breathing w/o retractions or accessory muscle accessory muscle use  Abdominal: Bowel sounds present and normal.  GU: Deferred  Rectal: Deferred  Musculoskeletal: Moves all extremities. Able to ambulate without assistance.  Strength 5/5 equal bilaterally.  Posterior tibialis, dorsalis pedis pulses 2+ equal bilaterally.  4 cm linear ecchymosis noted left medial calf, tender to palpation.   Skin: Warm and dry.  Psychiatric: normal mood and  affect. Behavior is normal.  Neurological: A&O x3, no acute deficit, focal weakness.     DIAGNOSTICS  Labs:  Labs listed below were reviewed and interpreted by me.  No results found for any visits on 05/17/22.  Radiology:       ED COURSE/MEDICAL DECISION MAKING          Medical Decision Making  Pain and swelling of lower leg, left: acute illness or injury  Amount and/or Complexity of Data Reviewed  Radiology: ordered.        CLINICAL IMPRESSION  Clinical Impression   Pain and swelling of lower leg, left     DISPOSITION  Discharged       DISCHARGE MEDICATIONS  There are no discharge medications for this patient.      [name]   05/17/2022, 14:40   [facility]  Department of Emergency [place]    This note was partially generated using MModal Fluency Direct system, and there may be some incorrect words, spellings, and punctuation that were not noted in checking the note before saving.    -----

## 2022-11-03 IMAGING — MR MRI KNEE LT W/O CONTRAST
5 series · 40 of 40 positions shown · non-contrast
Comparison: None available.

﻿EXAM:  [id number]   MRI KNEE LT W/O CONTRAST
INDICATION: Left knee pain.
TECHNIQUE: Noncontrast multiplanar, multisequence MRI was performed.

[Series 5: PD fat-sat · axial · left · 4.0mm · 0.53mm/px · z∈[-58,+73]mm · 8 of 30 slices shown (1 of 3)]
[im 1/30]
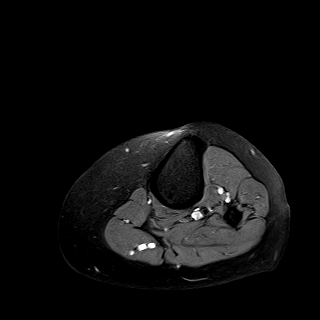
[im 5/30]
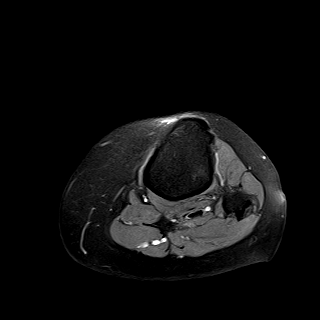
[im 9/30]
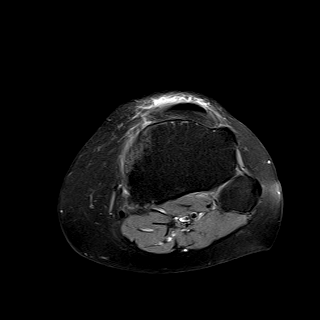
[im 13/30]
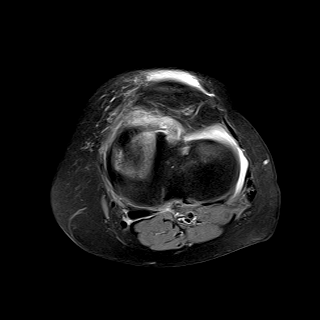
[im 17/30]
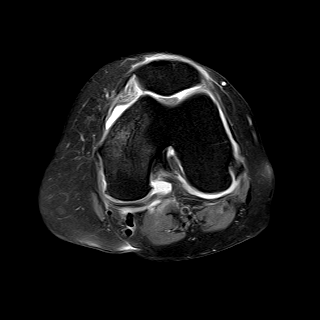
[im 21/30]
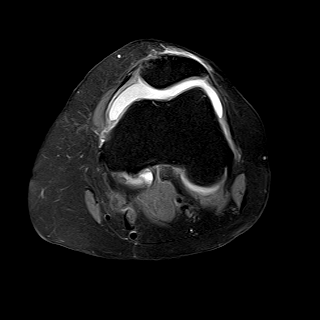
[im 25/30]
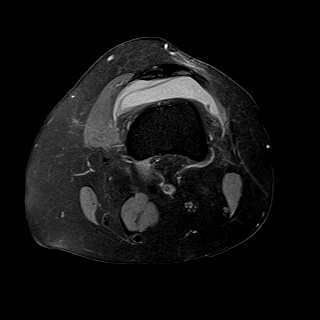
[im 30/30]
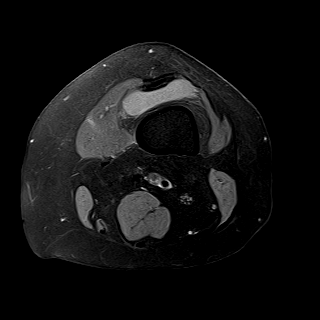

[Series 6: PD fat-sat · sagittal · left · 3.0mm · 0.47mm/px · 9 of 30 slices shown (2 of 3)]
[im 1/30]
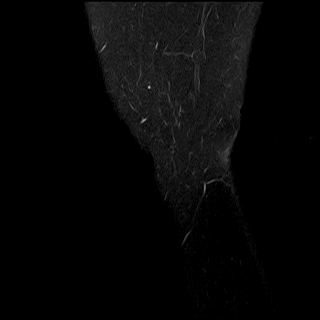
[im 4/30]
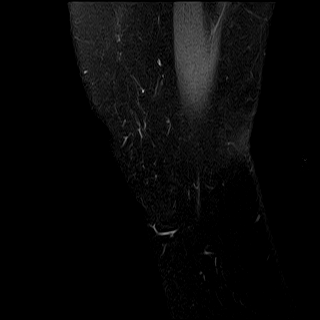
[im 8/30]
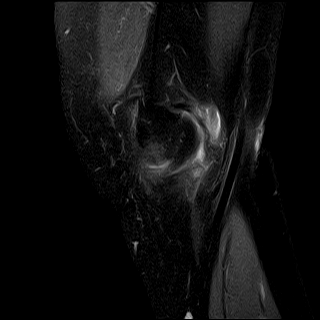
[im 11/30]
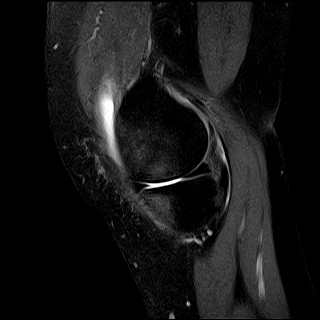
[im 15/30]
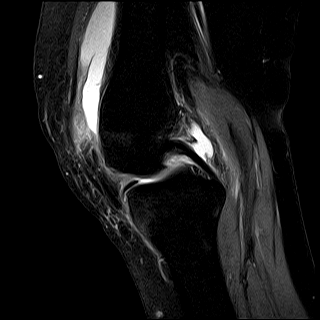
[im 19/30]
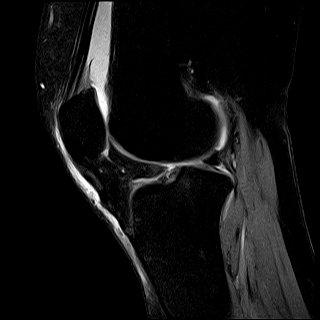
[im 22/30]
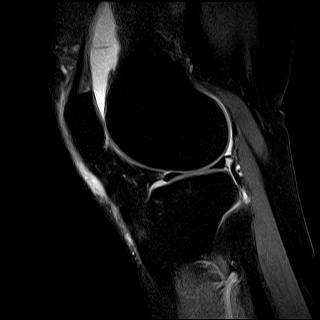
[im 26/30]
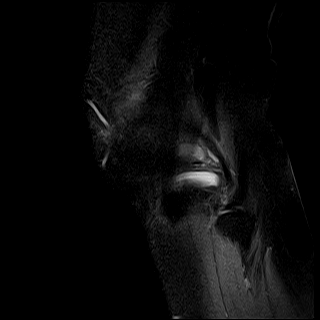
[im 30/30]
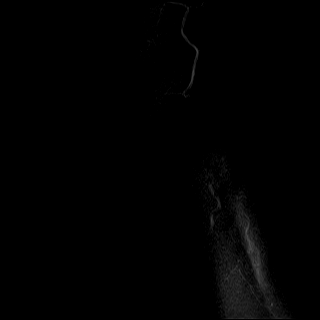

[Series 7: T1 · sagittal · left · 3.0mm · 0.39mm/px · 9 of 30 slices shown]
[im 1/30]
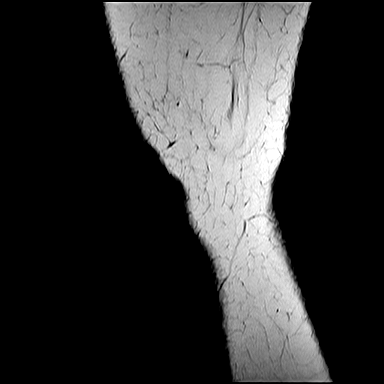
[im 4/30]
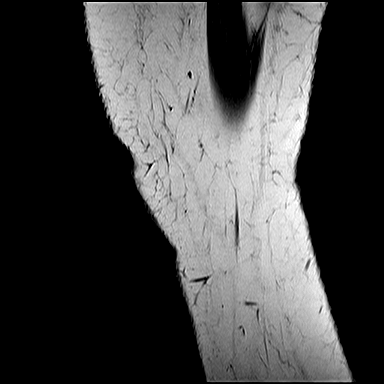
[im 8/30]
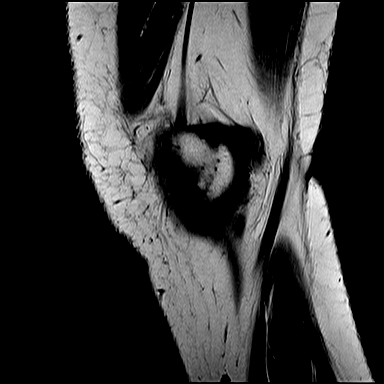
[im 11/30]
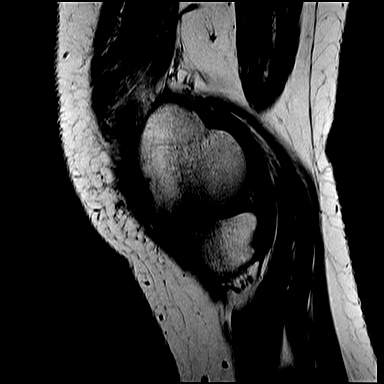
[im 15/30]
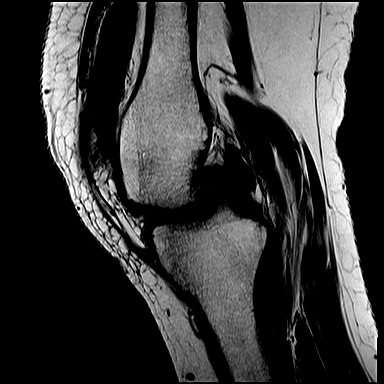
[im 19/30]
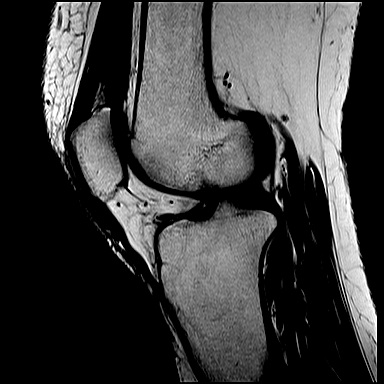
[im 22/30]
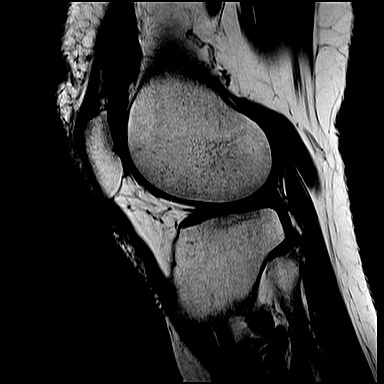
[im 26/30]
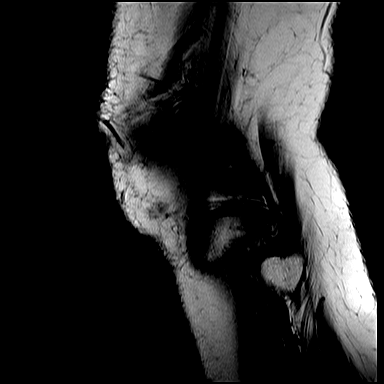
[im 30/30]
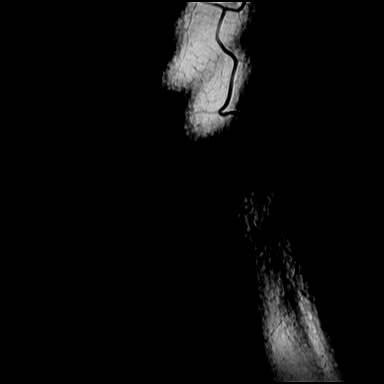

[Series 8: STIR · coronal · left · 3.5mm · 0.47mm/px · 7 of 22 slices shown]
[im 1/22]
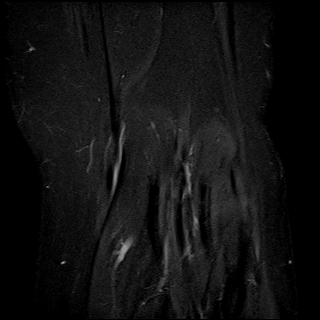
[im 4/22]
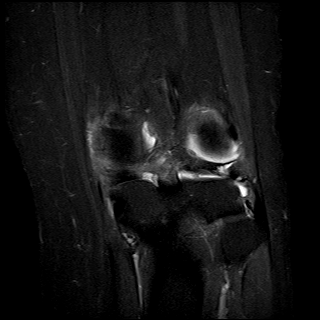
[im 8/22]
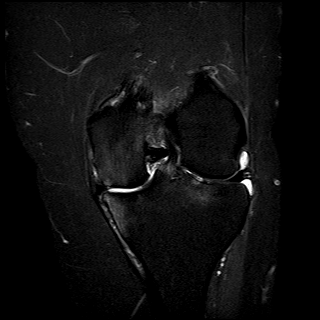
[im 11/22]
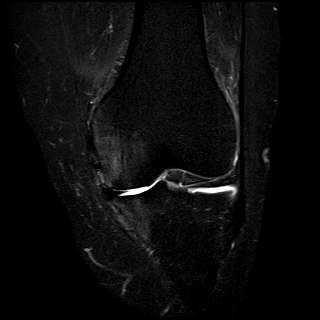
[im 15/22]
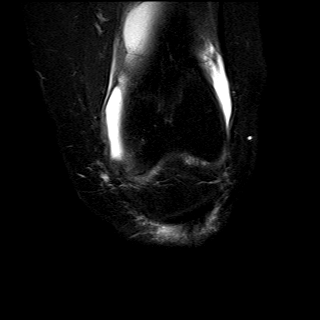
[im 18/22]
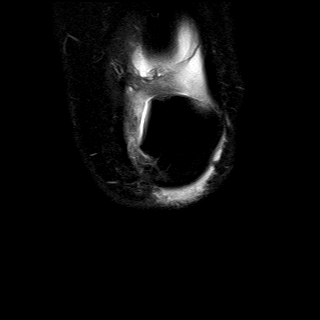
[im 22/22]
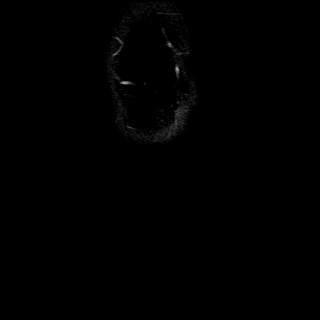

[Series 9: PD fat-sat · coronal · left · 3.5mm · 0.47mm/px · 7 of 22 slices shown (3 of 3)]
[im 1/22]
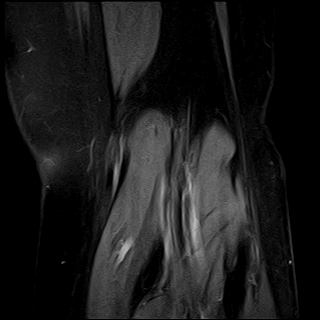
[im 4/22]
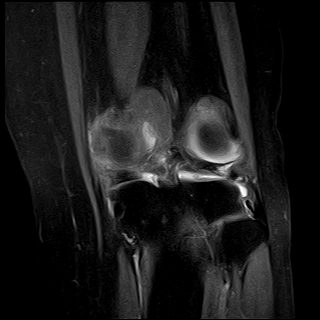
[im 8/22]
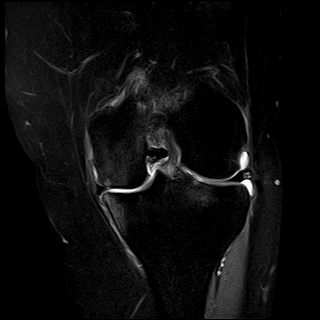
[im 11/22]
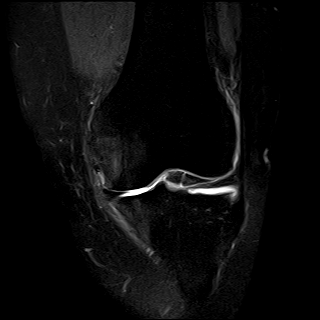
[im 15/22]
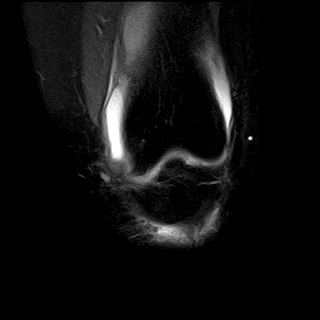
[im 18/22]
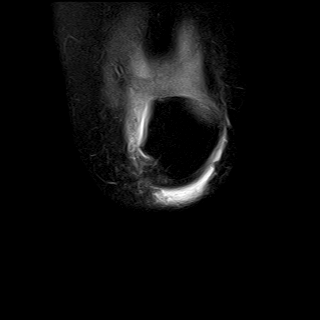
[im 22/22]
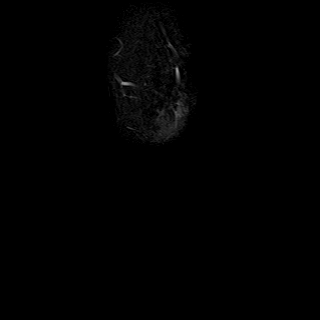

[40 of 40 positions shown; findings below may reference images not displayed]

FINDINGS: There is a small joint effusion.  

there are mild to moderate degenerative changes.  

There is a partial tear of the medial collateral ligament.  There are bone contusions in the adjacent lateral tibial plateau and lateral femoral condyle.  

Minimal marrow signal alteration in the medial tibial plateau probably represents degenerative change.  There is a small adjacent focal articular cartilage defect.  

There is mild patellar tendinosis with adjacent soft tissue edema.  

The menisci, cruciate ligaments, and lateral collateral ligament appear intact.  There is no fracture or dislocation.
IMPRESSION: Partial medial collateral ligament tear with adjacent bone contusions.

## 2022-11-19 ENCOUNTER — Other Ambulatory Visit (HOSPITAL_COMMUNITY): Payer: Self-pay

## 2022-11-19 DIAGNOSIS — Z1231 Encounter for screening mammogram for malignant neoplasm of breast: Secondary | ICD-10-CM

## 2022-11-23 ENCOUNTER — Encounter (HOSPITAL_COMMUNITY): Payer: Self-pay

## 2022-11-23 ENCOUNTER — Inpatient Hospital Stay
Admission: RE | Admit: 2022-11-23 | Discharge: 2022-11-23 | Disposition: A | Discharge status: Home / Self Care | Payer: Medicare Other | Source: Ambulatory Visit

## 2022-11-23 ENCOUNTER — Other Ambulatory Visit: Payer: Self-pay

## 2022-11-23 DIAGNOSIS — Z1231 Encounter for screening mammogram for malignant neoplasm of breast: Secondary | ICD-10-CM

## 2022-11-23 HISTORY — DX: Malignant (primary) neoplasm, unspecified (CMS HCC): C80.1

## 2022-11-23 HISTORY — DX: Malignant (primary) neoplasm, unspecified: C80.1

## 2023-03-03 ENCOUNTER — Other Ambulatory Visit (HOSPITAL_COMMUNITY): Payer: Self-pay | Admitting: FAMILY PRACTICE

## 2023-03-03 DIAGNOSIS — Z780 Asymptomatic menopausal state: Secondary | ICD-10-CM

## 2023-03-23 ENCOUNTER — Other Ambulatory Visit: Payer: Self-pay

## 2023-03-23 ENCOUNTER — Inpatient Hospital Stay
Admission: RE | Admit: 2023-03-23 | Discharge: 2023-03-23 | Disposition: A | Discharge status: Home / Self Care | Payer: Medicare Other | Source: Ambulatory Visit | Attending: FAMILY PRACTICE | Admitting: FAMILY PRACTICE

## 2023-03-23 DIAGNOSIS — Z780 Asymptomatic menopausal state: Secondary | ICD-10-CM | POA: Insufficient documentation

## 2023-04-08 ENCOUNTER — Inpatient Hospital Stay (HOSPITAL_COMMUNITY)
Admission: RE | Admit: 2023-04-08 | Discharge: 2023-04-08 | Disposition: A | Discharge status: Home / Self Care | Payer: Medicare Other | Source: Ambulatory Visit | Attending: Orthopaedic Surgery

## 2023-04-08 ENCOUNTER — Other Ambulatory Visit: Payer: Self-pay

## 2023-04-08 ENCOUNTER — Other Ambulatory Visit: Payer: Medicare Other | Attending: Orthopaedic Surgery

## 2023-04-08 ENCOUNTER — Other Ambulatory Visit (HOSPITAL_COMMUNITY): Payer: Self-pay | Admitting: Orthopaedic Surgery

## 2023-04-08 DIAGNOSIS — Z01818 Encounter for other preprocedural examination: Secondary | ICD-10-CM

## 2023-04-08 LAB — URINALYSIS, MACROSCOPIC
BILIRUBIN: NEGATIVE mg/dL
BLOOD: NEGATIVE mg/dL
GLUCOSE: NEGATIVE mg/dL
KETONES: NEGATIVE mg/dL
LEUKOCYTES: 25 WBCs/uL — AB
NITRITE: NEGATIVE
PH: 5.5 (ref 5.0–9.0)
PROTEIN: NEGATIVE mg/dL
SPECIFIC GRAVITY: 1.022 (ref 1.002–1.030)
UROBILINOGEN: NORMAL mg/dL

## 2023-04-08 LAB — CBC WITH DIFF
BASOPHIL #: 0.1 10*3/uL (ref 0.00–0.10)
BASOPHIL %: 1 % (ref 0–1)
EOSINOPHIL #: 0.1 10*3/uL (ref 0.00–0.50)
EOSINOPHIL %: 2 %
HCT: 39.4 % (ref 31.2–41.9)
HGB: 13.5 g/dL (ref 10.9–14.3)
LYMPHOCYTE #: 1.4 10*3/uL (ref 1.00–3.00)
LYMPHOCYTE %: 27 % (ref 16–44)
MCH: 31.1 pg (ref 24.7–32.8)
MCHC: 34.2 g/dL (ref 32.3–35.6)
MCV: 90.9 fL (ref 75.5–95.3)
MONOCYTE #: 0.5 10*3/uL (ref 0.30–1.00)
MONOCYTE %: 10 % (ref 5–13)
MPV: 8.8 fL (ref 7.9–10.8)
NEUTROPHIL #: 3.1 10*3/uL (ref 1.85–7.80)
NEUTROPHIL %: 60 % (ref 43–77)
PLATELETS: 216 10*3/uL (ref 140–440)
RBC: 4.33 10*6/uL (ref 3.63–4.92)
RDW: 13.8 % (ref 12.3–17.7)
WBC: 5.2 10*3/uL (ref 3.8–11.8)

## 2023-04-08 LAB — URINALYSIS, MICROSCOPIC
RBCS: 1 /hpf (ref <4)
SQUAMOUS EPITHELIAL: <1 /hpf (ref <28)
WBCS: 1 /hpf (ref <6)

## 2023-04-08 LAB — BASIC METABOLIC PANEL
ANION GAP: 5 mmol/L (ref 4–13)
BUN/CREA RATIO: 24 — ABNORMAL HIGH (ref 6–22)
BUN: 17 mg/dL (ref 7–25)
CALCIUM: 9.6 mg/dL (ref 8.6–10.3)
CHLORIDE: 107 mmol/L (ref 98–107)
CO2 TOTAL: 29 mmol/L (ref 21–31)
CREATININE: 0.71 mg/dL (ref 0.60–1.30)
ESTIMATED GFR: 93 mL/min/{1.73_m2} (ref >59)
GLUCOSE: 115 mg/dL — ABNORMAL HIGH (ref 74–109)
OSMOLALITY, CALCULATED: 284 mOsm/kg (ref 270–290)
POTASSIUM: 3.9 mmol/L (ref 3.5–5.1)
SODIUM: 141 mmol/L (ref 136–145)

## 2023-04-09 LAB — HGA1C (HEMOGLOBIN A1C WITH EST AVG GLUCOSE): HEMOGLOBIN A1C: 6 % (ref 4.0–6.0)

## 2023-04-14 DIAGNOSIS — I440 Atrioventricular block, first degree: Secondary | ICD-10-CM

## 2023-04-14 DIAGNOSIS — Z01810 Encounter for preprocedural cardiovascular examination: Secondary | ICD-10-CM

## 2023-04-14 LAB — ECG 12 LEAD
Atrial Rate: 65 {beats}/min
Calculated P Axis: 47 degrees
Calculated R Axis: 65 degrees
Calculated T Axis: 58 degrees
PR Interval: 226 ms
QRS Duration: 84 ms
QT Interval: 394 ms
QTC Calculation: 409 ms
Ventricular rate: 65 {beats}/min

## 2023-05-03 ENCOUNTER — Encounter (HOSPITAL_COMMUNITY): Payer: Self-pay | Admitting: Orthopaedic Surgery

## 2023-05-04 ENCOUNTER — Encounter (HOSPITAL_COMMUNITY): Payer: Self-pay

## 2023-05-05 ENCOUNTER — Encounter (HOSPITAL_COMMUNITY): Admission: RE | Payer: Self-pay | Source: Ambulatory Visit

## 2023-05-05 ENCOUNTER — Inpatient Hospital Stay (HOSPITAL_COMMUNITY): Admission: RE | Admit: 2023-05-05 | Payer: Medicare Other | Source: Ambulatory Visit | Admitting: Orthopaedic Surgery

## 2023-05-05 HISTORY — DX: Insomnia, unspecified: G47.00

## 2023-05-05 HISTORY — DX: Unspecified osteoarthritis, unspecified site: M19.90

## 2023-05-05 HISTORY — DX: Personal history of other diseases of the circulatory system: Z86.79

## 2023-05-05 HISTORY — DX: Polyneuropathy, unspecified: G62.9

## 2023-05-05 HISTORY — DX: Anxiety disorder, unspecified: F41.9

## 2023-05-05 HISTORY — DX: Dorsopathy, unspecified: M53.9

## 2023-05-05 HISTORY — DX: Gastro-esophageal reflux disease without esophagitis: K21.9

## 2023-05-05 HISTORY — DX: Prediabetes: R73.03

## 2023-05-05 HISTORY — DX: Depression, unspecified: F32.A

## 2023-05-05 HISTORY — DX: Personal history of other specified conditions: Z87.898

## 2023-05-05 HISTORY — DX: Other general symptoms and signs: R68.89

## 2023-05-05 HISTORY — DX: Fibromyalgia: M79.7

## 2023-05-05 HISTORY — DX: Other specified disorders of bone density and structure, unspecified site: M85.80

## 2023-05-05 SURGERY — ARTHROPLASTY KNEE TOTAL BILATERAL
Site: Knee | Laterality: Bilateral

## 2023-06-04 LAB — COLOGUARD® COLON CANCER SCREEN: COLOGUARD RESULT: POSITIVE — AB

## 2023-06-04 LAB — LAB: COLOGUARD RESULT: POSITIVE — AB

## 2023-06-24 ENCOUNTER — Encounter (INDEPENDENT_AMBULATORY_CARE_PROVIDER_SITE_OTHER): Payer: Self-pay | Admitting: Surgery

## 2023-07-05 ENCOUNTER — Encounter (INDEPENDENT_AMBULATORY_CARE_PROVIDER_SITE_OTHER): Payer: Self-pay | Admitting: Surgery

## 2023-07-05 ENCOUNTER — Ambulatory Visit (INDEPENDENT_AMBULATORY_CARE_PROVIDER_SITE_OTHER): Payer: Medicare Other | Admitting: Surgery

## 2023-07-05 ENCOUNTER — Other Ambulatory Visit: Payer: Self-pay

## 2023-07-05 VITALS — BP 108/83 | HR 81 | Temp 98.1°F | Resp 18 | Ht 66.0 in | Wt 188.0 lb

## 2023-07-05 DIAGNOSIS — R195 Other fecal abnormalities: Secondary | ICD-10-CM

## 2023-07-05 MED ORDER — SUTAB 1.479-0.188-0.225 GRAM TABLET
ORAL_TABLET | ORAL | 0 refills | Status: DC
Start: 2023-07-05 — End: 2023-08-31

## 2023-07-10 NOTE — Progress Notes (Signed)
GENERAL SURGERY, [facility]  [address]  [place] [address]    History and Physical    Name: Ashley Reid MRN:  [id number]   Date: 07/05/2023 DOB:  July 04, 1956 (67 y.o.)              Reason for Visit: Colonoscopy    History of Present Illness  Ms. Cannady presents today for colonoscopy because of positive Cologuard.    Negative diabetes, blood thinner, family history colon cancer      Review of the result(s) of each unique test:  Patient underwent diagnostic testing ( none ) prior to this dates visit.  I have personally reviewed the results and that serves as a component of the medical decision making for this encounter       Review of prior external note(s) from each unique source:  Patients referral to this office including a recent assessment by the referring provider.  This was reviewed by me for this unique office visit for the indication and intent of the referral as well as any pertinent medical or surgical history relevant to the patients independent evaluation by me today.      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    Depression     Fibromyalgia     GERD (gastroesophageal reflux disease)     History of heart murmur in childhood     History of palpitations     "Heart beating hard"    Insomnia     Neck problem     Neuropathy (CMS HCC)     Osteopenia     Prediabetes          Past Surgical History:   Procedure Laterality Date    BREAST CYST EXCISION      HX BACK SURGERY      HX CYST INCISION AND DRAINAGE Right 1974    REMOVED CYST    HX SHOULDER SURGERY Right     HX TONSILLECTOMY      KNEE CARTILAGE SURGERY Right     SKIN CANCER EXCISION      (L) leg         Current Outpatient Medications   Medication Sig    carvediloL (COREG) 3.125 mg Oral Tablet Take 1 Tablet (3.125 mg total) by mouth Twice daily    celecoxib (CELEBREX) 200 mg Oral Capsule Take 1 Capsule (200 mg  total) by mouth Once a day    escitalopram oxalate (LEXAPRO) 10 mg Oral Tablet Take 1 Tablet (10 mg total) by mouth Once a day    famotidine (PEPCID) 40 mg Oral Tablet Take 1 Tablet (40 mg total) by mouth Twice daily    gabapentin (NEURONTIN) 600 mg Oral Tablet Take 1 Tablet (600 mg total) by mouth Three times a day    lansoprazole (PREVACID SOLUTAB) 15 mg Oral Tablet,Rapid Dissolve, DR Take 2 Tablets (30 mg total) by mouth Once a day    LORazepam (ATIVAN) 1 mg Oral Tablet Take 1 Tablet (1 mg total) by mouth Every night    simvastatin (ZOCOR) 20 mg Oral Tablet Take 1 Tablet (20 mg total) by mouth Every evening    sod sulf-pot chloride-mag sulf (SUTAB) 1.479-0.188- 0.225 gram Oral Tablet Take 12 pills at 10-10:30 am. Take 12 pills at 6-6:30pm. Drink plenty  of fluids all day/evening.    traMADoL (ULTRAM) 50 mg Oral Tablet Take by mouth Twice per day as needed for Pain     Allergies   Allergen Reactions    Penicillins Rash    Diclofenac  Other Adverse Reaction (Add comment)     "Chest pain"     Family Medical History:       Problem Relation (Age of Onset)    No Known Problems Mother, Father, Sister, Brother, Maternal Grandmother, Maternal Grandfather, Paternal Grandmother, Paternal Grandfather, Daughter, Son, Maternal Aunt, Maternal Uncle, Paternal Aunt, Paternal Uncle, Other            Social History     Tobacco Use    Smoking status: Never    Smokeless tobacco: Never   Vaping Use    Vaping status: Never Used   Substance Use Topics    Alcohol use: Never    Drug use: Never            Physical Examination:  Vitals:    07/05/23 1100   BP: 108/83   Pulse: 81   Resp: 18   Temp: 36.7 C (98.1 F)   SpO2: 92%   Weight: 85.3 kg (188 lb)   Height: 1.676 m (5\' 6" )   BMI: 30.41        General: appropriate for age. in no acute distress.    Vital signs are present above and have been reviewed by me     HEENT: Atraumatic, Normocephalic. PERRLA. EOMI. Nose clear. Throat clear    Lungs: Nonlabored breathing with symmetric expansion.  Clear to auscultation bilaterally    Heart:Regular wth respect to rate and rythmn.    Abdomen:Soft. Nontender. Nondistended and benign    Extremities: Grossly normal. No major deformities     Neuro:  Grossly normal motor and sensory function    Psychiatric: Alert and oriented to person, place, and time. affect appropriate      Assessment and Plan  Colonoscopy scheduled for 08/31/2023 at 12:00 p.m.     Discussed indications, risks, and benefits of colonoscopy with possible biopsy/polypectomy with the patient.  Discussed the possibility of polypectomy, biopsies, and possible repeat examinations.  Risks include bleeding, sedation risks, possibility of missed diagnosis of polyp or malignancy, and remote possibilities of perforation and death.  All questions were answered, and informed consent was clearly obtained.      Follow Up:  No follow-ups on file.      ICD-10-CM    1. Positive colorectal cancer screening using Cologuard test  R19.5           Arlene Brickel B Ronnel Zuercher, MD ,MBA,FACS    I appreciate the opportunity to be involved in the care of your patients.  If you have any questions or concerns regarding this encounter, please do not hesitate to contact me at your convenience.      This note may have been partially generated using MModal Fluency Direct system, and there may be some incorrect words, spellings, and punctuation that were not noted in checking the note before saving, though effort was made to avoid such errors.

## 2023-08-31 ENCOUNTER — Inpatient Hospital Stay
Admission: RE | Admit: 2023-08-31 | Discharge: 2023-08-31 | Disposition: A | Discharge status: Home / Self Care | Payer: Medicare Other | Source: Ambulatory Visit | Attending: Surgery | Admitting: Surgery

## 2023-08-31 ENCOUNTER — Encounter (HOSPITAL_COMMUNITY): Payer: Medicare Other | Admitting: Surgery

## 2023-08-31 ENCOUNTER — Encounter (HOSPITAL_COMMUNITY)
Admission: RE | Disposition: A | Discharge status: Home / Self Care | Payer: Self-pay | Source: Ambulatory Visit | Attending: Surgery

## 2023-08-31 ENCOUNTER — Ambulatory Visit (HOSPITAL_COMMUNITY): Payer: Medicare Other | Admitting: Certified Registered"

## 2023-08-31 ENCOUNTER — Encounter (HOSPITAL_COMMUNITY): Payer: Self-pay | Admitting: Surgery

## 2023-08-31 ENCOUNTER — Other Ambulatory Visit: Payer: Self-pay

## 2023-08-31 ENCOUNTER — Other Ambulatory Visit (INDEPENDENT_AMBULATORY_CARE_PROVIDER_SITE_OTHER): Payer: Self-pay | Admitting: Surgery

## 2023-08-31 DIAGNOSIS — F419 Anxiety disorder, unspecified: Secondary | ICD-10-CM | POA: Insufficient documentation

## 2023-08-31 DIAGNOSIS — Q438 Other specified congenital malformations of intestine: Secondary | ICD-10-CM | POA: Insufficient documentation

## 2023-08-31 DIAGNOSIS — M19071 Primary osteoarthritis, right ankle and foot: Secondary | ICD-10-CM | POA: Insufficient documentation

## 2023-08-31 DIAGNOSIS — R195 Other fecal abnormalities: Secondary | ICD-10-CM | POA: Insufficient documentation

## 2023-08-31 DIAGNOSIS — M160 Bilateral primary osteoarthritis of hip: Secondary | ICD-10-CM | POA: Insufficient documentation

## 2023-08-31 DIAGNOSIS — M19011 Primary osteoarthritis, right shoulder: Secondary | ICD-10-CM | POA: Insufficient documentation

## 2023-08-31 DIAGNOSIS — K219 Gastro-esophageal reflux disease without esophagitis: Secondary | ICD-10-CM | POA: Insufficient documentation

## 2023-08-31 DIAGNOSIS — M47819 Spondylosis without myelopathy or radiculopathy, site unspecified: Secondary | ICD-10-CM | POA: Insufficient documentation

## 2023-08-31 DIAGNOSIS — Z85828 Personal history of other malignant neoplasm of skin: Secondary | ICD-10-CM | POA: Insufficient documentation

## 2023-08-31 DIAGNOSIS — M542 Cervicalgia: Secondary | ICD-10-CM | POA: Insufficient documentation

## 2023-08-31 DIAGNOSIS — M797 Fibromyalgia: Secondary | ICD-10-CM | POA: Insufficient documentation

## 2023-08-31 DIAGNOSIS — G4730 Sleep apnea, unspecified: Secondary | ICD-10-CM | POA: Insufficient documentation

## 2023-08-31 DIAGNOSIS — M549 Dorsalgia, unspecified: Secondary | ICD-10-CM | POA: Insufficient documentation

## 2023-08-31 DIAGNOSIS — Z9989 Dependence on other enabling machines and devices: Secondary | ICD-10-CM | POA: Insufficient documentation

## 2023-08-31 DIAGNOSIS — F32A Depression, unspecified: Secondary | ICD-10-CM | POA: Insufficient documentation

## 2023-08-31 SURGERY — COLONOSCOPY
Anesthesia: General | Wound class: Clean Contaminated Wounds-The respiratory, GI, Genital, or urinary

## 2023-08-31 MED ORDER — LIDOCAINE (PF) 100 MG/5 ML (2 %) INTRAVENOUS SYRINGE
INJECTION | Freq: Once | INTRAVENOUS | Status: DC | PRN
Start: 2023-08-31 — End: 2023-08-31
  Administered 2023-08-31: 100 mg via INTRAVENOUS

## 2023-08-31 MED ORDER — PROPOFOL 10 MG/ML IV BOLUS
INJECTION | Freq: Once | INTRAVENOUS | Status: DC | PRN
Start: 2023-08-31 — End: 2023-08-31
  Administered 2023-08-31 (×3): 100 mg via INTRAVENOUS

## 2023-08-31 MED ORDER — DEXTROSE 5 % AND LACTATED RINGERS INTRAVENOUS SOLUTION
INTRAVENOUS | Status: DC | PRN
Start: 2023-08-31 — End: 2023-08-31
  Administered 2023-08-31: 0 via INTRAVENOUS

## 2023-08-31 SURGICAL SUPPLY — 1 items: DETERGENT INSTR 22OZ TRNSPT GEL RINSE FREE NEUT PH PREKLENZ CLR PLSNT LF (MISCELLANEOUS PT CARE ITEMS) ×1 IMPLANT

## 2023-08-31 NOTE — H&P (Signed)
[facility]  General Surgery  History and Physical    Date of Service:  08/31/2023  Ashley Reid, 67 y.o. female  Date of Admission:  08/31/2023  Date of Birth:  1956-09-01  PCP: Ashley Shawl, DO    Reason for admission:  colonoscopy    HPI:  Ashley Reid is a 67 y.o. White female who is admitted for Positive Cologuard     Ashley Reid presents today for colonoscopy because of positive Cologuard.     Negative diabetes, blood thinner, family history colon cancer        Review of the result(s) of each unique test:  Patient underwent diagnostic testing ( none ) prior to this dates visit.  I have personally reviewed the results and that serves as a component of the medical decision making for this encounter        Review of prior external note(s) from each unique source:  Patients referral to this office including a recent assessment by the referring provider.  This was reviewed by me for this unique office visit for the indication and intent of the referral as well as any pertinent medical or surgical history relevant to the patients independent evaluation by me today.    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    Depression     Fibromyalgia     GERD (gastroesophageal reflux disease)     History of heart murmur in childhood     History of palpitations     "Heart beating hard"    Insomnia     Neck problem     Neuropathy (CMS HCC)     Osteopenia     Prediabetes       Past Surgical History:   Procedure Laterality Date    BREAST CYST EXCISION      HX BACK SURGERY      HX CYST INCISION AND DRAINAGE Right 1974    REMOVED CYST    HX SHOULDER SURGERY Right     HX TONSILLECTOMY      KNEE CARTILAGE SURGERY Right     SKIN CANCER EXCISION      (L) leg      Social History     Tobacco Use    Smoking status: Never    Smokeless tobacco: Never   Vaping Use    Vaping status: Never Used   Substance Use Topics    Alcohol use: Never     Drug use: Never       Family Medical History:       Problem Relation (Age of Onset)    No Known Problems Mother, Father, Sister, Brother, Maternal Grandmother, Maternal Grandfather, Paternal Grandmother, Paternal Grandfather, Daughter, Son, Maternal Aunt, Maternal Uncle, Paternal Aunt, Paternal Uncle, Other           Medications Prior to Admission       Prescriptions    carvediloL (COREG) 3.125 mg Oral Tablet    Take 1 Tablet (3.125 mg total) by mouth Twice daily    celecoxib (CELEBREX) 200 mg Oral Capsule    Take 1 Capsule (200 mg total) by mouth Once a day    escitalopram oxalate (LEXAPRO) 10 mg Oral Tablet    Take 1 Tablet (10 mg total) by mouth Once a day    famotidine (PEPCID) 40  mg Oral Tablet    Take 1 Tablet (40 mg total) by mouth Twice daily    gabapentin (NEURONTIN) 600 mg Oral Tablet    Take 1 Tablet (600 mg total) by mouth Three times a day    lansoprazole (PREVACID SOLUTAB) 15 mg Oral Tablet,Rapid Dissolve, DR    Take 2 Tablets (30 mg total) by mouth Once a day    LORazepam (ATIVAN) 1 mg Oral Tablet    Take 1 Tablet (1 mg total) by mouth Every night    simvastatin (ZOCOR) 20 mg Oral Tablet    Take 1 Tablet (20 mg total) by mouth Every evening    sod sulf-pot chloride-mag sulf (SUTAB) 1.479-0.188- 0.225 gram Oral Tablet    Take 12 pills at 10-10:30 am. Take 12 pills at 6-6:30pm. Drink plenty of fluids all day/evening.    traMADoL (ULTRAM) 50 mg Oral Tablet    Take by mouth Twice per day as needed for Pain           Allergies   Allergen Reactions    Penicillins Rash    Diclofenac  Other Adverse Reaction (Add comment)     "Chest pain"          No data found.       General: appropriate for age. in no acute distress.    Vital signs are present above and have been reviewed by me     HEENT: Atraumatic, Normocephalic. PERRLA, EOMI. Nose clear. Throat clear.    Lungs: Nonlabored breathing with symmetric expansion.  Clear to auscultation bilaterally    Heart:Regular wth respect to rate and  rythmn.    Abdomen:Soft. Nontender. Nondistended and benign    Extremities:  Grossly normal with good range of motion and no major deformities.    Neuro:  Grossly normal motor and sensory function. CN's II through XII intact.    Psychiatric: Alert and oriented to person, place, and time. affect appropriate    Laboratory Data:     No results found for any visits on 08/31/23 (from the past 24 hour(s)).    Imaging Studies:    No orders to display        Assessment/Plan:  Positive Cologuard    Colonoscopy scheduled for 08/31/2023 at 12:00 p.m.      Discussed indications, risks, and benefits of colonoscopy with possible biopsy/polypectomy with the patient.  Discussed the possibility of polypectomy, biopsies, and possible repeat examinations.  Risks include bleeding, sedation risks, possibility of missed diagnosis of polyp or malignancy, and remote possibilities of perforation and death.  All questions were answered, and informed consent was clearly obtained.    This note was partially created using voice recognition software and is inherently subject to errors including those of syntax and "sound alike " substitutions which may escape proof reading. In such instances, original meaning may be extrapolated by contextual derivation.    Fidela Juneau, MD, MBA, FACS

## 2023-08-31 NOTE — Anesthesia Preprocedure Evaluation (Signed)
ANESTHESIA PRE-OP EVALUATION  Planned Procedure: COLONOSCOPY  Review of Systems     anesthesia history negative     patient summary reviewed  nursing notes reviewed        Pulmonary   sleep apnea and CPAP,   Cardiovascular  negative cardio ROS,   ECG reviewed ,No peripheral edema,        GI/Hepatic/Renal    GERD and well controlled        Endo/Other    osteoarthritis (hips back shoulder right big toe and side of right foot),      Neuro/Psych/MS    back abnormality (L5 S1 surgery at age 67, and is now having back pain again), fibromyalgia, anxiety, depression, Neck problems (pain with extension of neck)    peripheral neuropathy,  Cancer  CA (squamous cell lower left leg),                 Physical Assessment      Airway         TM distance: >3 FB    Neck ROM: full  Mouth Opening: good.  No Facial hair  No Beard  No endotracheal tube present  No Tracheostomy present    Dental                    Pulmonary    Breath sounds clear to auscultation  (-) no rhonchi, no decreased breath sounds, no wheezes, no rales and no stridor     Cardiovascular    Rhythm: regular  Rate: Normal  (-) no friction rub, carotid bruit is not present, no peripheral edema and no murmur     Other findings  Implant in right back top          Plan  ASA 3     Planned anesthesia type: general     total intravenous anesthesia                    Intravenous induction     Anesthesia issues/risks discussed are: Dental Injuries, Eye /Visual Loss, Post-op Intubation/Ventilation, Central Line Placement, Post-op Pain Management, Post-op Agitation/Tantrum, Stroke, Aspiration, Difficult Airway, Sore Throat, Blood Loss, Intraoperative Awareness/ Recall, Cardiac Events/MI, Post-op Cognitive Dysfunction, Pulmonary Artery Cath Placement, Art Line Placement, PONV and Nerve Injuries.  Anesthetic plan and risks discussed with patient  signed consent obtained      Use of blood products discussed with who consented to blood products.      Patient's NPO status is  appropriate for Anesthesia.           Plan discussed with CRNA.

## 2023-08-31 NOTE — Discharge Instructions (Signed)
SURGICAL DISCHARGE INSTRUCTIONS     Dr. Donnal Debar, Gene B, MD  performed your COLONOSCOPY beyond the splenic flexure today at the [facility]  Findings: extremely redundant colon     [facility]:  Monday through Friday from 8 a.m. - 4 p.m.: [phone]    For T&D: [phone]  Between 4 p.m. - 8 a.m., weekends and holidays:  Call ER [phone]    PLEASE SEE WRITTEN HANDOUTS AS DISCUSSED BY YOUR NURSE:  Kurstin Dimarzo RN    SIGNS AND SYMPTOMS OF A WOUND / INCISION INFECTION   Be sure to watch for the following:  Increase in redness or red streaks near or around the wound or incision.  Increase in pain that is intense or severe and cannot be relieved by the pain medication that your doctor has given you.  Increase in swelling that cannot be relieved by elevation of a body part, or by applying ice, if permitted.  Increase in drainage, or if yellow / green in color and smells bad. This could be on a dressing or a cast.  Increase in fever for longer than 24 hours, or an increase that is higher than 101 degrees Fahrenheit (normal body temperature is 98 degrees Fahrenheit). The incision may feel warm to the touch.    **CALL YOUR DOCTOR IF ONE OR MORE OF THESE SIGNS / SYMPTOMS SHOULD OCCUR.    ANESTHESIA INFORMATION   ANESTHESIA -- ADULT PATIENTS:  You have received intravenous sedation / general anesthesia, and you may feel drowsy and light-headed for several hours. You may even experience some forgetfulness of the procedure. DO NOT DRIVE A MOTOR VEHICLE or perform any activity requiring complete alertness or coordination until you feel fully awake in about 24-48 hours. Do not drink alcoholic beverages for at least 24 hours. Do not stay alone, you must have a responsible adult available to be with you. You may also experience a dry mouth or nausea for 24 hours. This is a normal side effect and will disappear as the effects of the medication wear off.    REMEMBER   If you experience any  difficulty breathing, chest pain, bleeding that you feel is excessive, persistent nausea or vomiting or for any other concerns:  Call your physician Dr.  Donnal Debar, Cherylann Parr, MD   at [phone] . You may also ask to have the general doctor on call paged. They are available to you 24 hours a day.      SPECIAL INSTRUCTIONS / COMMENTS   Eat a high fiber diet & drink plenty of fluids. Walking will relieve gas pains.     FOLLOW-UP APPOINTMENTS   You will have an outpatient Small Bowel Series done. Dr. Donnal Debar office to contact you with appointment date and time. If you have not received a phone call within 7 days, please contact Dr. Donnal Debar office.

## 2023-08-31 NOTE — Anesthesia Postprocedure Evaluation (Signed)
Anesthesia Post Op Evaluation    Patient: Ashley Reid  Procedure(s):  COLONOSCOPY beyond the splenic flexure    Last Vitals:Temperature: 36.3 C (97.4 F) (08/31/23 1250)  Heart Rate: 72 (08/31/23 1250)  BP (Non-Invasive): 134/77 (08/31/23 1250)  Respiratory Rate: 20 (08/31/23 1250)  SpO2: 96 % (08/31/23 1250)    No notable events documented.    Patient is sufficiently recovered from the effects of anesthesia to participate in the evaluation and has returned to their pre-procedure level.  Patient location during evaluation: PACU       Patient participation: complete - patient participated  Level of consciousness: awake and alert and responsive to verbal stimuli    Pain management: adequate  Airway patency: patent    Anesthetic complications: no  Cardiovascular status: acceptable  Respiratory status: acceptable  Hydration status: acceptable  Patient post-procedure temperature: Pt Normothermic   PONV Status: Absent

## 2023-09-01 ENCOUNTER — Telehealth (INDEPENDENT_AMBULATORY_CARE_PROVIDER_SITE_OTHER): Payer: Self-pay | Admitting: Surgery

## 2023-09-03 NOTE — OR Surgeon (Signed)
[facility]      Patient Name: Ashley Reid, Ashley Reid Number: [id number]  Date of Service: 09/03/2023   Date of Birth: 06-12-1956      Pre-Operative Diagnosis: Positive Cologuard     Post-Operative Diagnosis: extremely redundant colon    Procedure(s)/Description:  COLONOSCOPY beyond the splenic flexure: 45378 (CPT)     Attending Surgeon: Fidela Juneau, MD     Anesthesia:  CRNA: Magdalene Patricia, CRNA    Anesthesia Type: .General     Specimens Removed: NONE    The patient indicates that they have read and understood the preoperative colonoscopy consent form. The benefits, risks and alternatives to the procedure were discussed. I specifically discussed the risk of bleeding and/or perforation requiring operation.  The patient was given ample opportunity to ask questions which were addressed to the patient's satisfaction.  The patient indicates they have no further question and wish to proceed. Informed consent was obtained from the patient and/or medical power of attorney.    The patient was brought into the procedure room and placed on the table in the left lateral decubitus position. After IV sedation was given, full finger digital rectal examination was performed with a circumferential sweep of the distal rectal mucosa. Subsequently, the flexible colonoscope was inserted into the rectum and passed without any difficulty. The colonoscope was then advanced up into the sigmoid colon, descending colon, transverse colon, right colon and cecum without any difficulty. Gross examination of each section of the colon was performed showing no specific abnormalities seen. Cecal intubation was achieved and the appendiceal orifice and ileocecal valve were identified. The colonoscope was withdrawn carefully examining the mucosa as the scope was being extracted with particular attention paid to the proximal sides of folds, flexures, bends and rectal valves. At approximately 10 cm. from the anal verge, the  colonoscope was retroflexed to fully examine the distal rectum. The colonoscope was removed and a repeat digital rectal examination was performed at the completion of the procedure. The patient tolerated the procedure well. No intraoperative complications were encountered.    EKG, pulse, pulse oximetry and blood pressure were monitored throughout the entire procedure.  There were no unplanned events.  The patient was instructed to contact me if they have any problems with their colon such as bleeding, pain or changes in bowel habits. They understood and agreed to do so.  The patient will not need another screening colonoscopy for 10 years which is according to ASGE guidelines. However, if in the future the patient has any problems with abdominal pain, changes in bowel habits, blood in stool, etc., then they should contact me because they may be a candidate for diagnostic colonoscopy before the 10 year time limit.    The patient will be scheduled for a smallbowel follow through.    Johanthan Kneeland B. Metzli Pollick, MD, MBA, FACS  [facility] -General Surgery

## 2023-09-05 ENCOUNTER — Other Ambulatory Visit: Payer: Self-pay

## 2023-09-05 ENCOUNTER — Ambulatory Visit (HOSPITAL_BASED_OUTPATIENT_CLINIC_OR_DEPARTMENT_OTHER)
Admission: RE | Admit: 2023-09-05 | Discharge: 2023-09-05 | Disposition: A | Discharge status: Home / Self Care | Payer: Medicare Other | Source: Ambulatory Visit | Attending: Orthopaedic Surgery | Admitting: Orthopaedic Surgery

## 2023-09-05 ENCOUNTER — Other Ambulatory Visit: Payer: Medicare Other | Attending: Orthopaedic Surgery

## 2023-09-05 ENCOUNTER — Other Ambulatory Visit (HOSPITAL_COMMUNITY): Payer: Self-pay | Admitting: Orthopaedic Surgery

## 2023-09-05 DIAGNOSIS — Z01818 Encounter for other preprocedural examination: Secondary | ICD-10-CM

## 2023-09-05 DIAGNOSIS — R9431 Abnormal electrocardiogram [ECG] [EKG]: Secondary | ICD-10-CM

## 2023-09-05 DIAGNOSIS — Z131 Encounter for screening for diabetes mellitus: Secondary | ICD-10-CM | POA: Insufficient documentation

## 2023-09-05 LAB — BASIC METABOLIC PANEL
ANION GAP: 6 mmol/L (ref 4–13)
BUN/CREA RATIO: 25 — ABNORMAL HIGH (ref 6–22)
BUN: 18 mg/dL (ref 7–25)
CALCIUM: 10.1 mg/dL (ref 8.6–10.3)
CHLORIDE: 104 mmol/L (ref 98–107)
CO2 TOTAL: 31 mmol/L (ref 21–31)
CREATININE: 0.72 mg/dL (ref 0.60–1.30)
ESTIMATED GFR: 92 mL/min/{1.73_m2} (ref >59)
GLUCOSE: 111 mg/dL — ABNORMAL HIGH (ref 74–109)
OSMOLALITY, CALCULATED: 284 mosm/kg (ref 270–290)
POTASSIUM: 3.9 mmol/L (ref 3.5–5.1)
SODIUM: 141 mmol/L (ref 136–145)

## 2023-09-05 LAB — URINALYSIS, MACROSCOPIC
BILIRUBIN: NEGATIVE mg/dL
BLOOD: NEGATIVE mg/dL
GLUCOSE: NEGATIVE mg/dL
KETONES: NEGATIVE mg/dL
LEUKOCYTES: 75 WBCs/uL — AB
NITRITE: NEGATIVE
PH: 5.5 (ref 5.0–9.0)
PROTEIN: NEGATIVE mg/dL
SPECIFIC GRAVITY: 1.018 (ref 1.002–1.030)
UROBILINOGEN: NORMAL mg/dL

## 2023-09-05 LAB — ECG 12 LEAD
Atrial Rate: 79 {beats}/min
Calculated P Axis: 47 degrees
Calculated R Axis: 59 degrees
Calculated T Axis: 22 degrees
PR Interval: 190 ms
QRS Duration: 78 ms
QT Interval: 382 ms
QTC Calculation: 438 ms
Ventricular rate: 79 {beats}/min

## 2023-09-05 LAB — CBC
HCT: 41.5 % (ref 31.2–41.9)
HGB: 14.2 g/dL (ref 10.9–14.3)
MCH: 29.9 pg (ref 24.7–32.8)
MCHC: 34.1 g/dL (ref 32.3–35.6)
MCV: 87.6 fL (ref 75.5–95.3)
MPV: 9.5 fL (ref 7.9–10.8)
PLATELETS: 210 10*3/uL (ref 140–440)
RBC: 4.74 10*6/uL (ref 3.63–4.92)
RDW: 13.8 % (ref 12.3–17.7)
WBC: 4.2 10*3/uL (ref 3.8–11.8)

## 2023-09-05 LAB — URINALYSIS, MICROSCOPIC
RBCS: <1 /[HPF] (ref <4)
SQUAMOUS EPITHELIAL: 1 /[HPF] (ref <28)
WBCS: 3 /[HPF] (ref <6)

## 2023-09-05 LAB — HGA1C (HEMOGLOBIN A1C WITH EST AVG GLUCOSE): HEMOGLOBIN A1C: 6 % (ref 4.0–6.0)

## 2023-09-12 ENCOUNTER — Ambulatory Visit
Admission: RE | Admit: 2023-09-12 | Discharge: 2023-09-12 | Disposition: A | Discharge status: Home / Self Care | Payer: Medicare Other | Source: Ambulatory Visit | Attending: Surgery | Admitting: Surgery

## 2023-09-12 ENCOUNTER — Other Ambulatory Visit: Payer: Self-pay

## 2023-09-12 DIAGNOSIS — R195 Other fecal abnormalities: Secondary | ICD-10-CM | POA: Insufficient documentation

## 2023-09-12 LAB — URINE CULTURE,ROUTINE: URINE CULTURE: 50000 — AB

## 2023-09-12 MED ORDER — BARIUM SULFATE 96 % (W/W) ORAL POWDER FOR SUSPENSION
176.0000 g | INHALATION_SUSPENSION | ORAL | Status: AC
Start: 2023-09-12 — End: 2023-09-12
  Administered 2023-09-12: 330 g via ORAL

## 2023-09-19 ENCOUNTER — Other Ambulatory Visit: Payer: Self-pay

## 2023-09-19 ENCOUNTER — Ambulatory Visit (INDEPENDENT_AMBULATORY_CARE_PROVIDER_SITE_OTHER): Payer: Medicare Other | Admitting: Surgery

## 2023-09-19 ENCOUNTER — Encounter (INDEPENDENT_AMBULATORY_CARE_PROVIDER_SITE_OTHER): Payer: Self-pay | Admitting: Surgery

## 2023-09-19 VITALS — BP 132/77 | HR 79 | Temp 98.2°F | Ht 66.0 in | Wt 188.0 lb

## 2023-09-19 DIAGNOSIS — K6389 Other specified diseases of intestine: Secondary | ICD-10-CM

## 2023-09-19 DIAGNOSIS — Z9889 Other specified postprocedural states: Secondary | ICD-10-CM

## 2023-09-20 NOTE — Progress Notes (Signed)
GENERAL SURGERY, [facility]  [address]  [place] [address]    Progress Note    Name: Ashley Reid MRN:  [id number]   Date: 09/19/2023 DOB:  [birth year] (67 y.o.)                Date of Service:  09/20/2023  Ashley Reid, 67 y.o. female  Date of Birth:  August 01, 1956  PCP: Earlean Shawl, DO  Referring:  No ref. provider found     HPI:  Ashley Reid is a 67 y.o. White female who returns after undergoing colonoscopy beyond splenic flexure and a subsequent upper GI small-bowel follow-through secondary to positive Cologuard.  The patient underwent colonoscopy on 09/03/2023 which showed a very redundant colon.  The small bowel follow was normal.  The patient has no complaints.          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    Depression     Fibromyalgia     GERD (gastroesophageal reflux disease)     History of heart murmur in childhood     History of palpitations     "Heart beating hard"    Insomnia     Neck problem     Neuropathy (CMS HCC)     Osteopenia     Prediabetes       Past Surgical History:   Procedure Laterality Date    BREAST CYST EXCISION      HX BACK SURGERY      HX CYST INCISION AND DRAINAGE Right 1974    REMOVED CYST    HX SHOULDER SURGERY Right     HX TONSILLECTOMY      KNEE CARTILAGE SURGERY Right     SKIN CANCER EXCISION      (L) leg      Outpatient Medications Marked as Taking for the 09/19/23 encounter (Office Visit) with Janyra Barillas, Cherylann Parr, MD   Medication Sig    carvediloL (COREG) 3.125 mg Oral Tablet Take 1 Tablet (3.125 mg total) by mouth Twice daily    celecoxib (CELEBREX) 200 mg Oral Capsule Take 1 Capsule (200 mg total) by mouth Once a day    escitalopram oxalate (LEXAPRO) 10 mg Oral Tablet Take 1 Tablet (10 mg total) by mouth Once a day    famotidine (PEPCID) 40 mg Oral Tablet Take 1 Tablet (40 mg total) by mouth Twice daily    gabapentin (NEURONTIN) 600 mg Oral Tablet  Take 1 Tablet (600 mg total) by mouth Three times a day    lansoprazole (PREVACID SOLUTAB) 15 mg Oral Tablet,Rapid Dissolve, DR Take 2 Tablets (30 mg total) by mouth Once a day    LORazepam (ATIVAN) 1 mg Oral Tablet Take 1 Tablet (1 mg total) by mouth Every night    simvastatin (ZOCOR) 20 mg Oral Tablet Take 1 Tablet (20 mg total) by mouth Every evening    traMADoL (ULTRAM) 50 mg Oral Tablet Take by mouth Twice per day as needed for Pain      Allergies   Allergen Reactions    Penicillins Rash    Diclofenac  Other Adverse Reaction (Add comment)     "Chest pain"           BP 132/77   Pulse 79   Temp 36.8 C (98.2 F)  Ht 1.676 m (5\' 6" )   Wt 85.3 kg (188 lb)   SpO2 95%   BMI 30.34 kg/m          General: appropriate for age. in no acute distress.    Vital signs are present above and have been reviewed by me     HEENT: Atraumatic, Normocephalic.    Lungs: Nonlabored breathing with symmetric expansion    Heart:Regular wth respect to rate and rythmn.    Abdomen:Soft. Nontender. Nondistended and benign    Psychiatric: Alert and oriented to person, place, and time. affect appropriate       Assessment/Plan:  Assessment/Plan   1. Post-operative state         The patient was informed about her redundant colon and to continue to drink plenty of water to keep her bowel habits regular.  The patient we will also take stool softeners and laxatives as needed.    The patient will not need another screening colonoscopy for 10 years which is according to ASGE guidelines. However, if in the future the patient has any problems with abdominal pain, changes in bowel habits, blood in stool, etc., then they should contact me because they may be a candidate for diagnostic colonoscopy before the 10 year time limit.    Return if symptoms worsen or fail to improve.     This note was partially created using voice recognition software and is inherently subject to errors including those of syntax and "sound alike " substitutions which may  escape proof reading. In such instances, original meaning may be extrapolated by contextual derivation.    Ashley Reimann B Emerlyn Mehlhoff, MD,MBA,FACS

## 2023-09-29 ENCOUNTER — Encounter (HOSPITAL_COMMUNITY): Payer: Self-pay | Admitting: Orthopaedic Surgery

## 2023-10-02 NOTE — H&P (Signed)
Ashley Reid  DOB: 10-28-1956  Id #: [id number]           CHIEF COMPLAINT  Right Knee pain; 07/19/23; PJB. LEFT Knee pain; PJB  HISTORY  Bilateral knee pain left greater than right x 5 years: This 67 -year-old female that has a history of fibromyalgia and some peripheral neuropathy and some back pain problems she notes left greater than right knee pain x 5 years. patient has a significant increase in symptoms left greater than right knee over the last 8 months. She progressed from walking about 6 miles a day down to walking less than 50 to 60 feet due to the medial joint knee pain and left and right side. She had an MRI in the past which did not show severe arthritis or meniscal tear. Her x-ray findings have progressed from a stage III to stage IV in both knees with complete loss of medial joint space.  Patient's been through physical therapy, injections, nonsteroidals and protected weightbearing with a cane. She also notes significant interference with sleep. Past medical history significant for prior back surgery.  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 Musculoskeletal: Denies OA, gout and other common musculoskeletal problems. Rheumatologic: Denies N/A Pulmonary: Sleep Apnea Sleep apnea treatment: CPAP  PAST SURGICAL HISTORY  Date Side/ Procedure Details Doctor Facility Complications  1  Site 11/09/99 Right Lower extremity-Knee 11/09/99 Tonsillectomy 11/09/99 Right Shoulder Other shoulder surgeries 11/09/99 Excision skin cancer Other knee surgery  ALLERGIES  Name Reactions Notes diclofenac Chest pain Penicillins unknown  MEDICATIONS  Name Pepcid ------Gastric Acid Secretion Reducers - Histamine H2-Receptor Antagonists Lexapro 10 mg 30 oral 1 po daily 2022-05-20 ------Antidepressant - Selective Serotonin Reuptake Inhibitors (SSRIs)  Neurontin 600 mg 30 oral 1 po tid 2022-05-20 ------Anticonvulsant - GABA Analogs Zocor 20 mg 30 oral 1 po dialy 2022-05-20 ------Antihyperlipidemic - HMG CoA Reductase Inhibitors (statins) Prevacid 30 mg 30 oral 1 po daily 2022-05-20 ------Gastric Acid Secretion Reducing Agents - Proton Pump Inhibitors (PPIs) Coreg 3.125 mg 30 oral 1 po bid 2022-05-20 ------Alpha-Beta Blockers Ativan 1 mg 30 oral 1 po daily 2022-05-20 ------Antianxiety Agent - Benzodiazepines PreserVision AREDS 7,160-113-100 unit-m 30 oral 2022-05-20 ------Multiple Vitamins and Mineral Combinations Celebrex 50 mg 30 oral 1 po daily 2022-05-20 ------NSAID Analgesic, Cyclooxygenase-2 (COX-2) Selective Inhibitors ------INTERACTIONS: Drug-drug interaction to Lexapro; Robaxin 750 mg 30 oral 1 po daily 2022-05-20 ------Skeletal Muscle Relaxant - Central Muscle Relaxants Ultram 50 mg 30 oral 2022-05-20 ------Analgesic Narcotic Agonists ------INTERACTIONS: Drug-drug interaction to Lexapro; T-Gesic 5-500 mg 30 oral 2022-05-20 ------Analgesic Narcotic Hydrocodone Combinations Xyzal 5 mg 30 oral 1 po daily 2022-05-20 ------Antihistamines - 2nd Generation Strength QTY Sig 20 mg 30 oral 1 po daily 2022-05-20 Date  Family and Social History  Patient is Married. Non-smoker Recode: 4 ETOH: None  2  Lives with family Mother deceased age 66;MI Father: deceased age 56,causeofdeath. 1 brothers; 1 sisters.; Diabetes in brother and Uncles / aunts. Hypertension in mother and father MI in mother Cholesterol mother and father N/A, father, Grandmother, Karalee Height / aunts, No known family history of Stroke, Bleeding, Blood clots, Neurologic conditions  REVIEW OF SYSTEMS  General: No history of change in weight, fever, chills, change in energy, fatigue or changes in sleep habits. Opthalmic: Negative for change in vision / eye problems. ENT: Denies congestion, sinus, throat and hearing issues. Respiratory: Denies SOB, cough and other respiratory symptoms.  Cardiac: Denies chest  pain, CHF and other cardiac symptoms. GI: No changes in bowel habits, abdominal pain or other GI complaint. No history of prior pregnancy. Musculoskeletal: Denies significant pain, joint or muscular symptoms. Neurological: Denies cephalgia, seizures, weakness, numbness and other neurologic symptoms. Endocrine: Denies polyuria, polydypsia and other endocrine related complaints. Behavioral: Denies significant mood and psychological symptoms. Hemeatologic: Denies anemia, bleeding / clotting problems and other blood related complaints. Patient has history of diagnosed OSA Hg A1C rec based on age, BMI RCRI, no risk factors, risk of cardiac death, nonfatal MI and cardiac arrest, 0.4%. Risk of MI, Pulmonary edema, ventricular fib, cardiac arrest and complete heart block 0.5% POUR / IPSS score 0. Score classified as mild level of symptoms, with no or mild increase in risk of urinary retention. DVT Prophylaxis : Based on current recommendation and patient specific risk factors, DVT prophlyaxis reccommended is Ecotrin 81 BID X 4 wk ASA II : Mild systemic disease Charlson score: Enter PMFSH then select calculate score resulting in estimated in hospital mortality rate of 1.2%  EXAM  BMI 29.29; Temp: ; HR ; BP /; H: 5\' 7" ; Wt 187lb pO2 / RA. Ideal Body weight target below 159.64 lbs. Reviewed BMI and local resources for weight management.  Right knee: Range of motion 6-1 20 moderate tenderness medial joint line varus alignment moderate patellofemoral crepitation. Ligament exam stable Left knee: Range of motion 3-1 25 moderate medial joint line pain. Positive McMurray's sign medial joint. Moderate patellofemoral crepitation.  No pain with rotation of either hip.  Walks with a somewhat crouched gait with flexion of both knees. Significant difficulty getting up a single step.  Pedal pulses are palpable.   Nerologic: sensation intact, vibratory senesation and proprioception preserved. Unabel to test tandem gait.    General exam    BMI   Cardiac, respiratory and mental status NORMAL  X-RAYS  Date Name Review 07/19/2023 L KN Lat Left Knee. Arthritis: Ardeen Jourdain Grade 4-end stage/N/ A;involvingmedial compartment. Progression of medial joint space loss to complete loss of medial joint space. Bilateral varus arthritis, Kelligren 3  Larsen grade 4- Significant loss of joint space with sclerosis, cyst formation and subchondral sclerosis indicating end stage destruction of the joint.  01/11/2023 Bil KN AP R Lat Stand 2VR Bilateral varus arthritis, Kelligren Larsen grade 4- Significant loss of joint space with sclerosis, cyst formation and subchondral sclerosis indicating end stage destruction of the joint. Progression of medial joint space loss to complete loss of medial joint space. 11/03/2022 L Knee MR PrimaryArthritis:KelligrenLarsenGrade3- moderate/Varus;involvingmedial compartments. Subchondral bone edema large area of medial tibia and MFC. High grade cartilage loss on MFC. MCL thickened, no definite tear. Partial medial collateral ligament tear with adjacent bone contusions. 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 Review of outside study & report  Other Treatments reviewed  Date Name Review  ASSESSMENT  Bilateral knee osteoarthritis, End-stage hyperlipidemia sleep apnea , HT, Tachcardia, osteopenia, hyhperglyhcemia, LBP.  Diagnosis  Bilateral knee osteoarthritis, End-stage hyperlipidemia sleep apnea .  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 We compared and contrasted unilateral staged versus bilateral procedure with regard to use of the robot, postoperative wound Billett rehabilitation, and risk profile. Patient expressed understanding feels that due to severe symptoms in both knees she wishes to proceed with simultaneous surgery. Shared decision making process was completed with the patient. KOOS is recorded. discussed risk  factors of infection, thromboembolic disease, and systemic issues. Patient  stratifies to aspirin and Kefzo; Patient-specific: morbidities include sleep apnea hypertension hyperlipidemia. Patient expressed understanding desire to proceed with surgery. Surgical informed consent is obtained.   Preop optimization:  OSA :Contoniue CPAP,  stable.       Orders:   Date Name Review 07/19/2023 L KN Lat Left Knee. Arthritis: Ardeen Jourdain Grade 4-end stage/N/ A;involvingmedial compartment. Progression of medial joint space loss to complete loss of medial joint space. Bilateral varus arthritis, Kelligren Larsen grade 4- Significant loss of joint space with sclerosis, cyst formation and subchondral sclerosis indicating end stage destruction of the joint. 01/11/2023 Bil KN AP R Lat Stand 2VR Bilateral varus arthritis, Kelligren Larsen grade 4- Significant loss of joint space with sclerosis, cyst formation and subchondral sclerosis indicating end stage destruction of the joint. Progression of medial joint space loss to complete loss of medial joint space. 4  11/03/2022 L Knee MR PrimaryArthritis:KelligrenLarsenGrade3- moderate/Varus;involvingmedial compartments. Subchondral bone edema large area of medial tibia and MFC. High grade cartilage loss on MFC. MCL thickened, no definite tear. Partial medial collateral ligament tear with adjacent bone contusions. 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 Review of outside study & report 05/20/2022 Bil KN XR AP LAT Stand 2VL 2VR Bilateral varus arthritis, Kelligren Larsen grade 3- moderate: Loss of joint space with osteophytes, early sclerosis and cystic change. 01/15/2022 LSSP Lumbar Spine MR N/A 07/01/2017 Bil KN XR AP LAT Stand 2VL 2VR N/A 01/19/2016 Bil KN AP L Lat Stand 2VL N/A 03/03/2012 Right Shoulder XR 2V N/A 09/24/2011 Right Shoulder XR 2V N/A  Date Name Review 01/11/2023 Bedside commode N/A 01/11/2023 Wheeled walker N/A  RETURN / FOLLOW-UP  Left and right total knee  replacement scheduled        Alexia Freestone, MD,FAAOS    Waldron Session, M.D. 07/19/2023 05:20:40  [id number]  V13       Attestation:  Patient has been examined before anesthesia, and there are no changes in medical status or indications for surgery.       CBC:  Recent Labs     09/05/23  1402   WBC 4.2   RBC 4.74   HGB 14.2   HCT 41.5   MCV 87.6   MCH 29.9   MCHC 34.1   RDW 13.8   PLTCNT 210   MPV 9.5         BMPL:  Recent Labs     09/05/23  1402   SODIUM 141   POTASSIUM 3.9   CHLORIDE 104   CO2 31   ANIONGAP 6   CALCIUM 10.1   GLUCOSENF 111*   BUN 18   CREATININE 0.72   BUNCRRATIO 25*   GFR 92   OSMBLD 284      UA:  Recent Labs     09/05/23  1402   COLOR Light Yellow   APPEARANCE Clear   SPECGRAVUR 1.018   PHURINE 5.5   LEUKOCYTES 75*   NITRITE Negative   PROTEIN Negative   GLUCOSE Negative   KETONES Negative   BILIRUBIN Negative   HGBURINE Negative   UROBILINOGEN Normal       A1C:  Recent Labs     09/05/23  1402   HA1C 6.0      HCG:              UA CULTURE:  Lab Results   Component Value Date    URINECX 50,000 CFU/mL Streptococcus agalactiae (Group B) (A) 09/05/2023          EKG:  Recent Results (from the past [id number] hour(s))   ECG 12 LEAD    Collection Time: 09/05/23  2:11 PM   Result Value    Ventricular rate 79    Atrial Rate 79    PR Interval 190    QRS Duration 78    QT Interval 382    QTC Calculation 438    Calculated P Axis 47    Calculated R Axis 59    Calculated T Axis 22    Narrative    Normal sinus rhythm  Nonspecific T wave abnormality  Abnormal ECG  When compared with ECG of 08-Apr-2023 15:26,  PR interval has decreased  Nonspecific T wave abnormality now evident in Anterior leads  Confirmed by Marita Snellen ([id number]) on 09/05/2023 9:07:59 PM

## 2023-10-03 ENCOUNTER — Other Ambulatory Visit: Payer: Self-pay

## 2023-10-03 ENCOUNTER — Encounter (HOSPITAL_COMMUNITY): Payer: Self-pay | Admitting: Orthopaedic Surgery

## 2023-10-03 ENCOUNTER — Encounter (HOSPITAL_COMMUNITY)
Admission: RE | Disposition: A | Discharge status: Home / Self Care | Payer: Self-pay | Source: Ambulatory Visit | Attending: Orthopaedic Surgery

## 2023-10-03 ENCOUNTER — Observation Stay
Admission: RE | Admit: 2023-10-03 | Discharge: 2023-10-04 | Disposition: A | Discharge status: Home / Self Care | Payer: Medicare Other | Source: Ambulatory Visit | Attending: Orthopaedic Surgery | Admitting: Orthopaedic Surgery

## 2023-10-03 ENCOUNTER — Inpatient Hospital Stay (HOSPITAL_COMMUNITY): Payer: Medicare Other | Admitting: Student in an Organized Health Care Education/Training Program

## 2023-10-03 ENCOUNTER — Observation Stay (HOSPITAL_COMMUNITY): Payer: Medicare Other | Admitting: Orthopaedic Surgery

## 2023-10-03 DIAGNOSIS — G4733 Obstructive sleep apnea (adult) (pediatric): Secondary | ICD-10-CM | POA: Diagnosis present

## 2023-10-03 DIAGNOSIS — Z96653 Presence of artificial knee joint, bilateral: Principal | ICD-10-CM

## 2023-10-03 DIAGNOSIS — M797 Fibromyalgia: Secondary | ICD-10-CM | POA: Insufficient documentation

## 2023-10-03 DIAGNOSIS — Z79899 Other long term (current) drug therapy: Secondary | ICD-10-CM | POA: Insufficient documentation

## 2023-10-03 DIAGNOSIS — M542 Cervicalgia: Secondary | ICD-10-CM | POA: Insufficient documentation

## 2023-10-03 DIAGNOSIS — M8580 Other specified disorders of bone density and structure, unspecified site: Secondary | ICD-10-CM | POA: Insufficient documentation

## 2023-10-03 DIAGNOSIS — E785 Hyperlipidemia, unspecified: Secondary | ICD-10-CM | POA: Insufficient documentation

## 2023-10-03 DIAGNOSIS — M25561 Pain in right knee: Secondary | ICD-10-CM | POA: Diagnosis present

## 2023-10-03 DIAGNOSIS — F32A Depression, unspecified: Secondary | ICD-10-CM | POA: Insufficient documentation

## 2023-10-03 DIAGNOSIS — K219 Gastro-esophageal reflux disease without esophagitis: Secondary | ICD-10-CM | POA: Insufficient documentation

## 2023-10-03 DIAGNOSIS — M170 Bilateral primary osteoarthritis of knee: Principal | ICD-10-CM | POA: Insufficient documentation

## 2023-10-03 DIAGNOSIS — R519 Headache, unspecified: Secondary | ICD-10-CM | POA: Insufficient documentation

## 2023-10-03 DIAGNOSIS — F419 Anxiety disorder, unspecified: Secondary | ICD-10-CM | POA: Insufficient documentation

## 2023-10-03 DIAGNOSIS — G4730 Sleep apnea, unspecified: Secondary | ICD-10-CM | POA: Insufficient documentation

## 2023-10-03 DIAGNOSIS — Z85828 Personal history of other malignant neoplasm of skin: Secondary | ICD-10-CM | POA: Insufficient documentation

## 2023-10-03 DIAGNOSIS — G629 Polyneuropathy, unspecified: Secondary | ICD-10-CM | POA: Insufficient documentation

## 2023-10-03 DIAGNOSIS — M545 Low back pain, unspecified: Secondary | ICD-10-CM | POA: Insufficient documentation

## 2023-10-03 DIAGNOSIS — T8484XA Pain due to internal orthopedic prosthetic devices, implants and grafts, initial encounter: Principal | ICD-10-CM | POA: Diagnosis present

## 2023-10-03 DIAGNOSIS — R7303 Prediabetes: Secondary | ICD-10-CM | POA: Diagnosis present

## 2023-10-03 DIAGNOSIS — Z7982 Long term (current) use of aspirin: Secondary | ICD-10-CM

## 2023-10-03 DIAGNOSIS — R000 Tachycardia, unspecified: Secondary | ICD-10-CM | POA: Insufficient documentation

## 2023-10-03 DIAGNOSIS — R7982 Elevated C-reactive protein (CRP): Secondary | ICD-10-CM | POA: Diagnosis present

## 2023-10-03 DIAGNOSIS — Z880 Allergy status to penicillin: Secondary | ICD-10-CM

## 2023-10-03 DIAGNOSIS — Z96659 Presence of unspecified artificial knee joint: Secondary | ICD-10-CM | POA: Diagnosis present

## 2023-10-03 DIAGNOSIS — M25562 Pain in left knee: Secondary | ICD-10-CM | POA: Diagnosis present

## 2023-10-03 HISTORY — DX: Sleep apnea, unspecified: G47.30

## 2023-10-03 HISTORY — DX: Headache, unspecified: R51.9

## 2023-10-03 HISTORY — DX: Hyperlipidemia, unspecified: E78.5

## 2023-10-03 HISTORY — DX: Dependence on other enabling machines and devices: Z99.89

## 2023-10-03 LAB — POC BLOOD GLUCOSE (RESULTS): GLUCOSE, POC: 160 mg/dL — ABNORMAL HIGH (ref 70–100)

## 2023-10-03 SURGERY — ARTHROPLASTY KNEE TOTAL BILATERAL
Anesthesia: Spinal | Site: Knee | Laterality: Bilateral | Wound class: Clean Wound: Uninfected operative wounds in which no inflammation occurred

## 2023-10-03 MED ORDER — HYDROCODONE 7.5 MG-ACETAMINOPHEN 325 MG TABLET
1.0000 | ORAL_TABLET | Freq: Four times a day (QID) | ORAL | 0 refills | Status: DC | PRN
Start: 2023-10-03 — End: 2023-10-07

## 2023-10-03 MED ORDER — FERROUS SULFATE 324 MG (65 MG IRON) TABLET,DELAYED RELEASE
324.0000 mg | DELAYED_RELEASE_TABLET | Freq: Two times a day (BID) | ORAL | Status: DC
Start: 2023-10-03 — End: 2023-10-04
  Administered 2023-10-03 – 2023-10-04 (×3): 324 mg via ORAL
  Filled 2023-10-03 (×3): qty 1

## 2023-10-03 MED ORDER — KETOROLAC 30 MG/ML (1 ML) INJECTION SOLUTION
15.0000 mg | Freq: Once | INTRAMUSCULAR | Status: DC
Start: 2023-10-03 — End: 2023-10-08

## 2023-10-03 MED ORDER — DEXAMETHASONE SODIUM PHOSPHATE (PF) 10 MG/ML INJECTION SOLUTION
10.0000 mg | Freq: Once | INTRAMUSCULAR | Status: AC
Start: 2023-10-03 — End: 2023-10-03
  Administered 2023-10-03: 10 mg via INTRAVENOUS

## 2023-10-03 MED ORDER — MIDAZOLAM 5 MG/ML INJECTION WRAPPER
Freq: Once | INTRAMUSCULAR | Status: DC | PRN
Start: 2023-10-03 — End: 2023-10-03
  Administered 2023-10-03: 2 mg via INTRAVENOUS
  Administered 2023-10-03 (×2): 1 mg via INTRAVENOUS

## 2023-10-03 MED ORDER — ROPIVACAINE (PF) 2 MG/ML (0.2 %) INJECTION SOLUTION
Freq: Once | INTRAMUSCULAR | Status: DC | PRN
Start: 2023-10-03 — End: 2023-10-03
  Administered 2023-10-03: 40 mL via INTRA_ARTICULAR

## 2023-10-03 MED ORDER — IPRATROPIUM 0.5 MG-ALBUTEROL 3 MG (2.5 MG BASE)/3 ML NEBULIZATION SOLN
3.0000 mL | INHALATION_SOLUTION | Freq: Once | RESPIRATORY_TRACT | Status: DC | PRN
Start: 2023-10-03 — End: 2023-10-03

## 2023-10-03 MED ORDER — CELECOXIB 100 MG CAPSULE
200.0000 mg | ORAL_CAPSULE | Freq: Two times a day (BID) | ORAL | Status: DC
Start: 2023-10-03 — End: 2023-10-04
  Administered 2023-10-03 – 2023-10-04 (×2): 200 mg via ORAL
  Filled 2023-10-03 (×2): qty 2

## 2023-10-03 MED ORDER — CELECOXIB 100 MG CAPSULE
ORAL_CAPSULE | ORAL | Status: AC
Start: 2023-10-03 — End: 2023-10-03
  Filled 2023-10-03: qty 2

## 2023-10-03 MED ORDER — MIDAZOLAM 5 MG/ML INJECTION WRAPPER
1.5000 mg | Freq: Once | INTRAMUSCULAR | Status: DC | PRN
Start: 2023-10-03 — End: 2023-10-03
  Administered 2023-10-03: 1.5 mg via INTRAVENOUS

## 2023-10-03 MED ORDER — METHOCARBAMOL 750 MG TABLET
750.0000 mg | ORAL_TABLET | Freq: Two times a day (BID) | ORAL | Status: DC | PRN
Start: 2023-10-03 — End: 2023-10-04

## 2023-10-03 MED ORDER — ACETAMINOPHEN 325 MG TABLET
ORAL_TABLET | ORAL | Status: AC
Start: 2023-10-03 — End: 2023-10-03
  Filled 2023-10-03: qty 3

## 2023-10-03 MED ORDER — PROPOFOL 10 MG/ML IV BOLUS
INJECTION | Freq: Once | INTRAVENOUS | Status: DC | PRN
Start: 2023-10-03 — End: 2023-10-03
  Administered 2023-10-03: 20 mg via INTRAVENOUS

## 2023-10-03 MED ORDER — SODIUM CHLORIDE 0.9 % (FLUSH) INJECTION SYRINGE
3.0000 mL | INJECTION | Freq: Three times a day (TID) | INTRAMUSCULAR | Status: DC
Start: 2023-10-03 — End: 2023-10-04
  Administered 2023-10-03 – 2023-10-04 (×4): 0 mL

## 2023-10-03 MED ORDER — OXYCODONE ER 10 MG TABLET,CRUSH RESISTANT,EXTENDED RELEASE 12 HR
10.0000 mg | EXTENDED_RELEASE_ORAL_TABLET | Freq: Once | ORAL | Status: AC
Start: 2023-10-03 — End: 2023-10-03
  Administered 2023-10-03: 10 mg via ORAL
  Filled 2023-10-03: qty 1

## 2023-10-03 MED ORDER — PHENYLEPHRINE 10 MG/ML INJECTION SOLUTION
Freq: Once | INTRAMUSCULAR | Status: DC | PRN
Start: 2023-10-03 — End: 2023-10-03
  Administered 2023-10-03 (×5): 100 ug via INTRAVENOUS

## 2023-10-03 MED ORDER — FAMOTIDINE (PF) 20 MG/2 ML INTRAVENOUS SOLUTION
INTRAVENOUS | Status: AC
Start: 2023-10-03 — End: 2023-10-03
  Filled 2023-10-03: qty 2

## 2023-10-03 MED ORDER — LACTATED RINGERS INTRAVENOUS SOLUTION
INTRAVENOUS | Status: DC
Start: 2023-10-03 — End: 2023-10-04
  Administered 2023-10-03: 0 via INTRAVENOUS

## 2023-10-03 MED ORDER — HYDROMORPHONE 2 MG/ML INJECTION WRAPPER
0.2000 mg | INJECTION | INTRAMUSCULAR | Status: DC | PRN
Start: 2023-10-03 — End: 2023-10-04

## 2023-10-03 MED ORDER — SODIUM CHLORIDE 0.9 % (FLUSH) INJECTION SYRINGE
3.0000 mL | INJECTION | Freq: Three times a day (TID) | INTRAMUSCULAR | Status: DC
Start: 2023-10-03 — End: 2023-10-03

## 2023-10-03 MED ORDER — FENTANYL (PF) 50 MCG/ML INJECTION WRAPPER
25.0000 ug | INJECTION | INTRAMUSCULAR | Status: DC | PRN
Start: 2023-10-03 — End: 2023-10-03

## 2023-10-03 MED ORDER — DOCUSATE SODIUM 100 MG CAPSULE
100.0000 mg | ORAL_CAPSULE | Freq: Two times a day (BID) | ORAL | Status: DC
Start: 2023-10-03 — End: 2023-10-04
  Administered 2023-10-03 – 2023-10-04 (×3): 100 mg via ORAL
  Filled 2023-10-03 (×3): qty 1

## 2023-10-03 MED ORDER — TRANEXAMIC ACID 1,000 MG/10 ML (100 MG/ML) INTRAVENOUS SOLUTION
1000.0000 mg | Freq: Once | INTRAVENOUS | Status: AC
Start: 2023-10-03 — End: 2023-10-03
  Administered 2023-10-03: 1000 mg via INTRAVENOUS

## 2023-10-03 MED ORDER — ACETAMINOPHEN 325 MG TABLET
975.0000 mg | ORAL_TABLET | Freq: Four times a day (QID) | ORAL | Status: AC
Start: 2023-10-03 — End: 2023-10-03
  Administered 2023-10-03 (×2): 975 mg via ORAL
  Filled 2023-10-03 (×2): qty 3

## 2023-10-03 MED ORDER — APREPITANT 40 MG CAPSULE
40.0000 mg | ORAL_CAPSULE | Freq: Once | ORAL | Status: AC
Start: 2023-10-03 — End: 2023-10-03
  Administered 2023-10-03: 40 mg via ORAL

## 2023-10-03 MED ORDER — FAMOTIDINE 20 MG TABLET
40.0000 mg | ORAL_TABLET | Freq: Two times a day (BID) | ORAL | Status: DC
Start: 2023-10-03 — End: 2023-10-04
  Administered 2023-10-03 – 2023-10-04 (×2): 40 mg via ORAL
  Filled 2023-10-03 (×2): qty 2

## 2023-10-03 MED ORDER — CEFAZOLIN 2 GRAM INTRAVENOUS SOLUTION
INTRAVENOUS | Status: AC
Start: 2023-10-03 — End: 2023-10-03
  Filled 2023-10-03: qty 14.71

## 2023-10-03 MED ORDER — APREPITANT 40 MG CAPSULE
ORAL_CAPSULE | ORAL | Status: AC
Start: 2023-10-03 — End: 2023-10-03
  Filled 2023-10-03: qty 1

## 2023-10-03 MED ORDER — SODIUM CHLORIDE 0.9 % (FLUSH) INJECTION SYRINGE
3.0000 mL | INJECTION | INTRAMUSCULAR | Status: DC | PRN
Start: 2023-10-03 — End: 2023-10-03

## 2023-10-03 MED ORDER — ONDANSETRON HCL (PF) 4 MG/2 ML INJECTION SOLUTION
8.0000 mg | Freq: Once | INTRAMUSCULAR | Status: AC
Start: 2023-10-03 — End: 2023-10-03
  Administered 2023-10-03: 8 mg via INTRAVENOUS

## 2023-10-03 MED ORDER — ATORVASTATIN 20 MG TABLET
20.0000 mg | ORAL_TABLET | Freq: Every evening | ORAL | Status: DC
Start: 2023-10-03 — End: 2023-10-04
  Administered 2023-10-03: 20 mg via ORAL
  Filled 2023-10-03: qty 1

## 2023-10-03 MED ORDER — ONDANSETRON HCL (PF) 4 MG/2 ML INJECTION SOLUTION
4.0000 mg | INTRAMUSCULAR | Status: DC | PRN
Start: 2023-10-03 — End: 2023-10-04

## 2023-10-03 MED ORDER — GABAPENTIN 300 MG CAPSULE
300.0000 mg | ORAL_CAPSULE | Freq: Once | ORAL | Status: AC
Start: 2023-10-03 — End: 2023-10-03
  Administered 2023-10-03: 300 mg via ORAL

## 2023-10-03 MED ORDER — NALOXONE 0.4 MG/ML INJECTION SOLUTION
0.4000 mg | INTRAMUSCULAR | Status: DC | PRN
Start: 2023-10-03 — End: 2023-10-04

## 2023-10-03 MED ORDER — ETHYL ALCOHOL 62 % TOPICAL SWAB
1.0000 | Freq: Two times a day (BID) | CUTANEOUS | Status: DC
Start: 2023-10-03 — End: 2023-10-04
  Administered 2023-10-03 – 2023-10-04 (×3): 1 via NASAL

## 2023-10-03 MED ORDER — OXYCODONE ER 10 MG TABLET,CRUSH RESISTANT,EXTENDED RELEASE 12 HR
EXTENDED_RELEASE_ORAL_TABLET | ORAL | Status: AC
Start: 2023-10-03 — End: 2023-10-03
  Filled 2023-10-03: qty 1

## 2023-10-03 MED ORDER — ROPIVACAINE (PF) 2 MG/ML (0.2 %) INJECTION SOLUTION
INTRAMUSCULAR | Status: AC
Start: 2023-10-03 — End: 2023-10-03
  Filled 2023-10-03: qty 40

## 2023-10-03 MED ORDER — TRAMADOL 50 MG TABLET
50.0000 mg | ORAL_TABLET | Freq: Four times a day (QID) | ORAL | Status: DC
Start: 2023-10-03 — End: 2023-10-05
  Administered 2023-10-03 (×2): 50 mg via ORAL
  Administered 2023-10-03: 0 mg via ORAL
  Administered 2023-10-04 (×2): 50 mg via ORAL
  Filled 2023-10-03 (×5): qty 1

## 2023-10-03 MED ORDER — LORAZEPAM 1 MG TABLET
1.0000 mg | ORAL_TABLET | Freq: Every evening | ORAL | Status: DC
Start: 2023-10-03 — End: 2023-10-04
  Administered 2023-10-03: 1 mg via ORAL
  Filled 2023-10-03: qty 1

## 2023-10-03 MED ORDER — OXYCODONE ER 10 MG TABLET,CRUSH RESISTANT,EXTENDED RELEASE 12 HR
10.0000 mg | EXTENDED_RELEASE_ORAL_TABLET | Freq: Once | ORAL | Status: AC
Start: 2023-10-03 — End: 2023-10-03
  Administered 2023-10-03: 10 mg via ORAL

## 2023-10-03 MED ORDER — GABAPENTIN 300 MG CAPSULE
ORAL_CAPSULE | ORAL | Status: AC
Start: 2023-10-03 — End: 2023-10-03
  Filled 2023-10-03: qty 1

## 2023-10-03 MED ORDER — ESCITALOPRAM 10 MG TABLET
10.0000 mg | ORAL_TABLET | Freq: Every day | ORAL | Status: DC
Start: 2023-10-03 — End: 2023-10-04
  Administered 2023-10-03: 10 mg via ORAL
  Filled 2023-10-03 (×2): qty 1

## 2023-10-03 MED ORDER — ONDANSETRON HCL (PF) 4 MG/2 ML INJECTION SOLUTION
4.0000 mg | Freq: Once | INTRAMUSCULAR | Status: DC | PRN
Start: 2023-10-03 — End: 2023-10-03

## 2023-10-03 MED ORDER — ASPIRIN 81 MG CHEWABLE TABLET
81.0000 mg | CHEWABLE_TABLET | Freq: Two times a day (BID) | ORAL | Status: DC
Start: 2023-10-04 — End: 2023-10-04
  Administered 2023-10-04: 81 mg via ORAL
  Filled 2023-10-03: qty 1

## 2023-10-03 MED ORDER — OXYCODONE 5 MG TABLET
10.0000 mg | ORAL_TABLET | ORAL | Status: DC | PRN
Start: 2023-10-03 — End: 2023-10-04
  Administered 2023-10-04: 10 mg via ORAL
  Filled 2023-10-03: qty 2

## 2023-10-03 MED ORDER — BISACODYL 10 MG RECTAL SUPPOSITORY
10.0000 mg | Freq: Every day | RECTAL | Status: DC | PRN
Start: 2023-10-03 — End: 2023-10-04

## 2023-10-03 MED ORDER — FAMOTIDINE 20 MG TABLET
20.0000 mg | ORAL_TABLET | Freq: Two times a day (BID) | ORAL | Status: DC
Start: 2023-10-03 — End: 2023-10-03

## 2023-10-03 MED ORDER — BUPIVACAINE (PF) 0.75 % (7.5 MG/ML) IN 8.25 % DEXTROSE INJECTION
Freq: Once | INTRAMUSCULAR | Status: AC | PRN
Start: 2023-10-03 — End: 2023-10-03
  Administered 2023-10-03: 1.8 mL via INTRATHECAL

## 2023-10-03 MED ORDER — FAMOTIDINE (PF) 20 MG/2 ML INTRAVENOUS SOLUTION
20.0000 mg | Freq: Once | INTRAVENOUS | Status: AC
Start: 2023-10-03 — End: 2023-10-03
  Administered 2023-10-03: 20 mg via INTRAVENOUS

## 2023-10-03 MED ORDER — FENTANYL (PF) 50 MCG/ML INJECTION WRAPPER
INJECTION | Freq: Once | INTRAMUSCULAR | Status: AC | PRN
Start: 2023-10-03 — End: 2023-10-03
  Administered 2023-10-03: 20 ug via INTRATHECAL

## 2023-10-03 MED ORDER — GABAPENTIN 600 MG TABLET
600.0000 mg | ORAL_TABLET | Freq: Three times a day (TID) | ORAL | Status: DC
Start: 2023-10-03 — End: 2023-10-04
  Administered 2023-10-03 – 2023-10-04 (×3): 600 mg via ORAL
  Filled 2023-10-03 (×3): qty 1

## 2023-10-03 MED ORDER — ASPIRIN 81 MG TABLET,DELAYED RELEASE
81.0000 mg | DELAYED_RELEASE_TABLET | Freq: Two times a day (BID) | ORAL | Status: AC
Start: 2023-10-03 — End: ?

## 2023-10-03 MED ORDER — MULTIVITAMIN WITH FOLIC ACID 400 MCG TABLET
1.0000 | ORAL_TABLET | Freq: Every day | ORAL | Status: DC
Start: 2023-10-03 — End: 2023-10-04
  Administered 2023-10-03 – 2023-10-04 (×2): 1 via ORAL
  Filled 2023-10-03 (×2): qty 1

## 2023-10-03 MED ORDER — FENTANYL (PF) 50 MCG/ML INJECTION SOLUTION
INTRAMUSCULAR | Status: AC
Start: 2023-10-03 — End: 2023-10-03
  Filled 2023-10-03: qty 2

## 2023-10-03 MED ORDER — TRANEXAMIC ACID 1,000 MG/10 ML (100 MG/ML) INTRAVENOUS SOLUTION
INTRAVENOUS | Status: AC
Start: 2023-10-03 — End: 2023-10-03
  Filled 2023-10-03: qty 10

## 2023-10-03 MED ORDER — LACTATED RINGERS INTRAVENOUS SOLUTION
INTRAVENOUS | Status: DC
Start: 2023-10-03 — End: 2023-10-03

## 2023-10-03 MED ORDER — ALBUTEROL SULFATE 2.5 MG/3 ML (0.083 %) SOLUTION FOR NEBULIZATION
2.5000 mg | INHALATION_SOLUTION | Freq: Once | RESPIRATORY_TRACT | Status: DC | PRN
Start: 2023-10-03 — End: 2023-10-03

## 2023-10-03 MED ORDER — MIDAZOLAM 5 MG/ML INJECTION WRAPPER
INTRAMUSCULAR | Status: AC
Start: 2023-10-03 — End: 2023-10-03
  Filled 2023-10-03: qty 1

## 2023-10-03 MED ORDER — ACETAMINOPHEN 325 MG TABLET
650.0000 mg | ORAL_TABLET | ORAL | Status: DC | PRN
Start: 2023-10-03 — End: 2023-10-04

## 2023-10-03 MED ORDER — ONDANSETRON HCL (PF) 4 MG/2 ML INJECTION SOLUTION
INTRAMUSCULAR | Status: AC
Start: 2023-10-03 — End: 2023-10-03
  Filled 2023-10-03: qty 4

## 2023-10-03 MED ORDER — MORPHINE 2 MG/ML INJECTION WRAPPER
2.0000 mg | INJECTION | INTRAMUSCULAR | Status: DC | PRN
Start: 2023-10-03 — End: 2023-10-04
  Administered 2023-10-03: 2 mg via INTRAVENOUS
  Filled 2023-10-03: qty 1

## 2023-10-03 MED ORDER — CARVEDILOL 3.125 MG TABLET
3.1250 mg | ORAL_TABLET | Freq: Two times a day (BID) | ORAL | Status: DC
Start: 2023-10-03 — End: 2023-10-04
  Administered 2023-10-03 – 2023-10-04 (×2): 3.125 mg via ORAL
  Filled 2023-10-03 (×2): qty 1

## 2023-10-03 MED ORDER — ACETAMINOPHEN 325 MG TABLET
975.0000 mg | ORAL_TABLET | Freq: Once | ORAL | Status: AC
Start: 2023-10-03 — End: 2023-10-03
  Administered 2023-10-03: 975 mg via ORAL

## 2023-10-03 MED ORDER — PANTOPRAZOLE 40 MG TABLET,DELAYED RELEASE
40.0000 mg | DELAYED_RELEASE_TABLET | Freq: Every evening | ORAL | Status: DC
Start: 2023-10-03 — End: 2023-10-04
  Administered 2023-10-03: 40 mg via ORAL
  Filled 2023-10-03: qty 1

## 2023-10-03 MED ORDER — FENTANYL (PF) 50 MCG/ML INJECTION WRAPPER
50.0000 ug | INJECTION | INTRAMUSCULAR | Status: DC | PRN
Start: 2023-10-03 — End: 2023-10-03

## 2023-10-03 MED ORDER — DEXAMETHASONE SODIUM PHOSPHATE (PF) 10 MG/ML INJECTION SOLUTION
8.0000 mg | Freq: Two times a day (BID) | INTRAMUSCULAR | Status: AC
Start: 2023-10-03 — End: 2023-10-04
  Administered 2023-10-03 – 2023-10-04 (×2): 8 mg via INTRAVENOUS
  Filled 2023-10-03 (×2): qty 1

## 2023-10-03 MED ORDER — ETHYL ALCOHOL 62 % TOPICAL SWAB
1.0000 | Freq: Once | CUTANEOUS | Status: AC
Start: 2023-10-03 — End: 2023-10-03
  Administered 2023-10-03: 1 via NASAL

## 2023-10-03 MED ORDER — SODIUM CHLORIDE 0.9 % INTRAVENOUS PIGGYBACK
2.0000 g | Freq: Once | INTRAVENOUS | Status: AC
Start: 2023-10-03 — End: 2023-10-03
  Administered 2023-10-03: 2 g via INTRAVENOUS

## 2023-10-03 MED ORDER — FENTANYL (PF) 50 MCG/ML INJECTION WRAPPER
INJECTION | Freq: Once | INTRAMUSCULAR | Status: DC | PRN
Start: 2023-10-03 — End: 2023-10-03
  Administered 2023-10-03: 80 ug via INTRAVENOUS

## 2023-10-03 MED ORDER — OXYCODONE 5 MG TABLET
5.0000 mg | ORAL_TABLET | ORAL | Status: DC | PRN
Start: 2023-10-03 — End: 2023-10-04
  Administered 2023-10-03 – 2023-10-04 (×2): 5 mg via ORAL
  Filled 2023-10-03 (×2): qty 1

## 2023-10-03 MED ORDER — DEXAMETHASONE SODIUM PHOSPHATE (PF) 10 MG/ML INJECTION SOLUTION
INTRAMUSCULAR | Status: AC
Start: 2023-10-03 — End: 2023-10-03
  Filled 2023-10-03: qty 1

## 2023-10-03 MED ORDER — PROCHLORPERAZINE EDISYLATE 10 MG/2 ML (5 MG/ML) INJECTION SOLUTION
5.0000 mg | Freq: Once | INTRAMUSCULAR | Status: DC | PRN
Start: 2023-10-03 — End: 2023-10-03

## 2023-10-03 MED ORDER — EPHEDRINE SULFATE 50 MG/ML INTRAVENOUS SOLUTION
Freq: Once | INTRAVENOUS | Status: DC | PRN
Start: 2023-10-03 — End: 2023-10-03
  Administered 2023-10-03 (×4): 10 mg via INTRAVENOUS

## 2023-10-03 MED ORDER — PROPOFOL 10 MG/ML INTRAVENOUS EMULSION
INTRAVENOUS | Status: DC | PRN
Start: 2023-10-03 — End: 2023-10-03
  Administered 2023-10-03: 0 ug/kg/min via INTRAVENOUS
  Administered 2023-10-03: 50 ug/kg/min via INTRAVENOUS

## 2023-10-03 SURGICAL SUPPLY — 52 items
ADH SKNCLS CYNCRLT SKINSTITCH LIQUID PREC APPL TIP NONST LF  DISP .5ML (SUTURE/WOUND CLOSURE) ×2 IMPLANT
BANDAGE ELT 5.8YDX4IN NONST SLFCLS ELAS KNIT TEAL END STCH VELCRO COTTON POLY BLND STD LGTH COMPRESS (WOUND CARE SUPPLY) ×2 IMPLANT
BANDAGE ELT 5.8YDX6IN NONST SLFCLS ELAS KNIT STRCH VELCRO COTTON POLY BLND STD LGTH COMPRESS BGE HNY (WOUND CARE SUPPLY) ×2 IMPLANT
BASEPLATE TIB TRIATH 3 KNEE TRIT (IMPLANTS KNEE) ×2 IMPLANT
BLADE 10 2 END CBNSTL SURG STRL DISP (SURGICAL CUTTING SUPPLIES) ×8 IMPLANT
BLADE SAW 90X25X1.27MM SGTL STRL LF (SURGICAL CUTTING SUPPLIES) ×2 IMPLANT
CONV USE ITEM 321878 - GLOVE SURG 7.5 LF PF BEAD CUF SMOOTH STRL WHT 12IN (GLOVES AND ACCESSORIES) ×3 IMPLANT
COUNTER 20 CNT BLOCK ADH NEEDLE STRL LF  RD SHARP FOAM 15.75X11.5X14IN DISP (MED SURG SUPPLIES) ×1 IMPLANT
COVER EQP 90X60IN HVDTY BCK PAD FNFLD BLU STRL (DRAPE/PACKS/SHEETS/OR TOWEL) ×3 IMPLANT
DETERGENT INSTR 22OZ TRNSPT GEL RINSE FREE NEUT PH PREKLENZ CLR PLSNT LF (MISCELLANEOUS PT CARE ITEMS) ×1 IMPLANT
DEVICE SUCT 2 FILTER CHAMBER SCKR STRL LF  DISP (MED SURG SUPPLIES) ×1 IMPLANT
DRAPE 76X55IN 3 QT HALYARD LF  STRL DISP SURG (DRAPE/PACKS/SHEETS/OR TOWEL) ×2 IMPLANT
DRAPE BILAT LIMB 2 CIRC FENESTRATE TUBE HLDR FLUID COLLECT PCH 144X116IN STRL SURG CONTROL + SMS 88 (DRAPE/PACKS/SHEETS/OR TOWEL) ×1 IMPLANT
DRAPE INCS ANTIMIC 23X23IN IOBN2 TRNSPR (DRAPE/PACKS/SHEETS/OR TOWEL) ×3 IMPLANT
DRESS 12X4IN PS MPLX BR FOAM ACUTE SURG WOUND (WOUND CARE SUPPLY) ×2 IMPLANT
DRESS COMPRESS 13FTX6IN JNS COTTON RL HI ABS LF  STRL .5LB (WOUND CARE SUPPLY) ×2 IMPLANT
ELECTRODE ESURG BLADE PNCL 15FT VLAB EDGE TELESCP SMOKE EVAC (SURGICAL CUTTING SUPPLIES) ×1 IMPLANT
FEM 3 PA CRCTE RTN BEAD TRIATH KNEE LFT COM STRL LF (IMPLANTS KNEE) ×1 IMPLANT
FEM 3 PA CRCTE RTN BEAD TRIATH KNEE RGT COM STRL LF (IMPLANTS KNEE) ×1 IMPLANT
GLOVE SURG 6 LF  PF SMOOTH BEAD CUF INTLK STRL BLU 11.3IN PROTEXIS NEU-THERA PLISPRN THK7.9 MIL (GLOVES AND ACCESSORIES) ×1 IMPLANT
GLOVE SURG 6.5 LF  PF SMOOTH BEAD CUF INTLK STRL BLU 11.3IN PROTEXIS NEU-THERA PLISPRN THK7.9 MIL (GLOVES AND ACCESSORIES) ×6 IMPLANT
GLOVE SURG 7.5 LF  PF SMOOTH BEAD CUF INTLK STRL BLU 11.8IN PROTEXIS NEU-THERA PLISPRN THK7.9 MIL (GLOVES AND ACCESSORIES) ×2 IMPLANT
GLOVE SURG 7.5 LTX PF SMOOTH BEAD CUF STRL YW 12IN PROTEXIS (GLOVES AND ACCESSORIES) ×1 IMPLANT
GLOVE SURG 8 LF  PF SMOOTH BEAD CUF INTLK STRL BLU 11.8IN PROTEXIS NEU-THERA PLISPRN THK7.9 MIL (GLOVES AND ACCESSORIES) ×1 IMPLANT
GLOVE SURG 8 LTX PF SMOOTH BEAD CUF STRL YW 12IN PROTEXIS NEU-THERA DDRGL THK8.7 MIL (GLOVES AND ACCESSORIES) ×1 IMPLANT
GLOVE SURG 8.5 LF  PF SMOOTH BEAD CUF INTLK STRL BLU 11.8IN PROTEXIS NEU-THERA PLISPRN THK7.9 MIL (GLOVES AND ACCESSORIES) ×1 IMPLANT
GOWN SURG 2XL STD LGTH REG L3 NONREINFORCE BRTHBL TWL STRL LF  DISP BLU HALYARD SPECTRUM SMS (DRAPE/PACKS/SHEETS/OR TOWEL) ×1 IMPLANT
GOWN SURG XL STD LGTH AAMI L4 BRTHBL HKLP CLSR LINT RST STRL AERO CR SMS FILM (DRAPE/PACKS/SHEETS/OR TOWEL) ×2 IMPLANT
GOWN SURG XL XLNG L4 REINF HKLP CLSR SET IN SLEEVE STRL LF  DISP BLU SIRUS SMS PE 56IN (DRAPE/PACKS/SHEETS/OR TOWEL) ×2 IMPLANT
HEMOSTAT ABS 14X2IN FLXB SHR W_V SRGCL STRL DISP (WOUND CARE SUPPLY) ×2 IMPLANT
INSERT TIB BRNG CRCTE RTN 3 13MM TRIATH X3 KNEE STRL LF (IMPLANTS KNEE) ×1 IMPLANT
INSERT TIB BRNG CRCTE RTN 3 9MM TRIATH X3 KNEE STRL LF (IMPLANTS KNEE) ×1 IMPLANT
LABEL MED CORRECT MED LABELING SYS 4 FLG 2 SHEET 24 PRPRNT STRL (MED SURG SUPPLIES) ×1 IMPLANT
MAT INSTR TRY 44X36IN WTPRF BACKSHEET TPNX BLU (MISCELLANEOUS PT CARE ITEMS) ×2 IMPLANT
MATTRESS TRANSF 78X34IN AIRPAL C1000LB LONG STD 2 BELT 2 HOSE ATTACHMENT PNT SNAP BUTTON LBL DISP (MED SURG SUPPLIES) ×1 IMPLANT
NEEDLE 1.5IN 18GA POLYPROP FIL LL HUB DEHP-FR STRL BLUNT BD REG WL LF  DISP RD (MED SURG SUPPLIES) ×2 IMPLANT
PATEL 9MM 29MM TRIT METAL ASYM TRIATH KNEE COM STRL (IMPLANTS KNEE) ×2 IMPLANT
SEALER ESURG .226IN .137IN AQUAMANTYS 6 30D .236IN SPC BIPOLAR 2 ELECTRODE HMST 5.1IN STRL LF  DISP (SURGICAL CUTTING SUPPLIES) ×1 IMPLANT
SET INTPLS SUCT TUBE FAN SPRAY TIP HANDPC STRL LF  DISP (MED SURG SUPPLIES) ×1 IMPLANT
SOL IRRG 0.9% NACL 2000ML PRSV FR N-PYRG FLXB CONTAINR STRL LF (MEDICATIONS/SOLUTIONS) ×1 IMPLANT
SOL IV 0.9% NACL 1000ML STRL PRSV FR FLXB CONTAINR LF (MEDICATIONS/SOLUTIONS) ×1 IMPLANT
SPONGE LAP 18X18IN PREWASH RIGID TRY STRL LF  WHT (MED SURG SUPPLIES) ×2 IMPLANT
SUTURE 1 CT STRATAFIX PDS + 18IN VIOL ABS KNOTLESS TISS CONTROL SMTR ABS (SUTURE/WOUND CLOSURE) ×2 IMPLANT
SUTURE 1 CT VICRYL 36IN VIOL BRD COAT ABS (SUTURE/WOUND CLOSURE) ×8 IMPLANT
SUTURE 2-0 CT1 STRATAFIX PDS + 18IN VIOL ABS KNOTLESS TISS CONTROL SMTR ABS (SUTURE/WOUND CLOSURE) ×2 IMPLANT
SUTURE 2-0 CT1 VICRYL 27IN UNDYED BRD COAT ABS (SUTURE/WOUND CLOSURE) ×2 IMPLANT
SUTURE 3-0 PS2 MONOCRYL MTPS 27IN UNDYED MONOF ABS (SUTURE/WOUND CLOSURE) ×2 IMPLANT
SUTURE 4-0 FS1 PROLENE 18IN BLU MONOF NONAB (SUTURE/WOUND CLOSURE) ×4 IMPLANT
SYRINGE LL 20ML STRL GRAD MED DISP (MED SURG SUPPLIES) ×2 IMPLANT
TOWEL 24X16IN COTTON BLU DISP SURG STRL LF (DRAPE/PACKS/SHEETS/OR TOWEL) ×4 IMPLANT
TRAY ~~LOC~~ ARTHROSCOPY ~~LOC~~ - ~~LOC~~ COMMUNITY HOSP (CUSTOM TRAYS & PACK) ×2 IMPLANT
WOUND IRRG IRRISEPT DBRD CLNSG 0.05% CHG SYSTEM STRL LF (WOUND CARE SUPPLY) ×2 IMPLANT

## 2023-10-03 NOTE — Anesthesia Preprocedure Evaluation (Signed)
ANESTHESIA PRE-OP EVALUATION  Planned Procedure: BILATERAL TOTAL KNEE ARTHROPLASTIES USING TRIATHLON IMPLANTS (Bilateral: Knee)  Review of Systems     anesthesia history negative     patient summary reviewed  nursing notes reviewed        Pulmonary   sleep apnea and CPAP,   Cardiovascular    ECG reviewed and hyperlipidemia ,No peripheral edema,  Exercise Tolerance: > or = 4 METS   ,beta blocker therapy  ,taken in last 24 hours     GI/Hepatic/Renal    GERD and well controlled        Endo/Other    osteoarthritis,      Neuro/Psych/MS    headaches, back abnormality, fibromyalgia, anxiety, depression, Neck problems (pain with extension of neck)    peripheral neuropathy,  Cancer  CA (squamous cell lower left leg),                     Physical Assessment      Airway       Mallampati: II    TM distance: >3 FB    Neck ROM: full  Mouth Opening: good.  No Facial hair  No Beard  No endotracheal tube present  No Tracheostomy present    Dental                    Pulmonary    Breath sounds clear to auscultation  (-) no rhonchi, no decreased breath sounds, no wheezes, no rales and no stridor     Cardiovascular    Rhythm: regular  Rate: Normal  (-) no friction rub, carotid bruit is not present, no peripheral edema and no murmur     Other findings  Implant in right back top            Plan  ASA 3     Planned anesthesia type: spinal           PONV Plan:  I plan to administer pharmcologic prophalaxis antiemetics  POV PLAN:   plan for postoperative opioid use            Intravenous induction     Anesthesia issues/risks discussed are: Nerve Injuries, Blood Loss, Local Anesthetic Systemic Toxicity, Failure of Block, Spinal Headache, High Neuraxial Block and Post-op Pain Management.  Anesthetic plan and risks discussed with patient  signed consent obtained      Use of blood products discussed with who consented to blood products.      Patient's NPO status is appropriate for Anesthesia.           Plan discussed with CRNA.

## 2023-10-03 NOTE — Nurses Notes (Signed)
Patient arrived to room 379. Blue is alert and verbally speaking with staff, husband at bedside, she was placed in bed using hovermat, foot pumps initiated, VS initiated, pt placed on 2L via nasal canula, she denies any pain, she was oriented to room and bed in low position, wheels locked, bed alarm on, call bell within reach.

## 2023-10-03 NOTE — OR Nursing (Signed)
PRE OP MEASURE      RT AK 20"         BK 13 1/2"      LT AK 20 1/4"         BK 13 1/4"

## 2023-10-03 NOTE — OT Evaluation (Signed)
[facility]  [address]  [place], [address]  [phone]  (Fax) [phone]  Rehabilitation Services  Occupational Therapy Inpatient Initial Evaluation      Patient Name: Ashley Reid  Date of Birth: 09/28/1956  Height: Height: 170.2 cm (5' 7.01")  Weight: Weight: 84.2 kg (185 lb 10 oz)  Room/Bed: 379/A  Payor: [employer] MEDICARE / Plan: [employer] MEDICARE ADV [place] / Product Type: PPO /         PMH:   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    CPAP (continuous positive airway pressure) dependence     Depression     Fibromyalgia     GERD (gastroesophageal reflux disease)     Headache     History of heart murmur in childhood     History of palpitations     "Heart beating hard"    Hyperlipidemia     Insomnia     Neck problem     Neuropathy (CMS HCC)     Osteopenia     Prediabetes     Sleep apnea            Assessment:      Functional Level at Time of Session: (P) Patient is a pleasant 67 year old female admitted for an elective bilateral  TKA. Prior to admission, she was independent in ADLs, IADLs and functional mobility. During OT/PT eval, patient was alert, oriented x4, cooperative and able to follow multistep commads. She has full UB AROM and good (4/5) UB strength. Patient engaged in bed mobility and transfers with PT (supine-EOB-BSC). Patient required SBA to engage in perineal hygiene. While on [facility], patient reported lightheadedness, nausea and "feels like she will pass out". BP was checked and was 77/44. Patient was on room air when SpO2 dropped to 85. She was placed back on O2. Patient was able to transfer back to bed safely and reported that her symptoms were slightly subsiding when she laid down. Nurse was called in and was at bedside throughout. OT to follow up for ADL training, DME/AE training and functional transfer training.    Discharge Needs:   Equipment Recommendation:   reacher, sock aid, long handled sponge, long shoe horn     The patient presents with mobility limitations due to impaired range of motion and weight bearing restrictions that significantly impair/prevent patient's ability to participate in mobility-related activities of daily living (MRADLs) including  ambulation and transfers in order to safely complete, toileting, bathing, safely entering/exiting the home, in reasonable time. This functional mobility deficit can be sufficiently resolved with the use of a Anticipated Equipment Needs at Discharge: (P) bathing equipment, dressing equipment, reacher  in order to decrease the risk of falls, morbidity, and mortality in performance of these MRADLs.  Patient is able to safely use this assistive device.    Discharge Disposition:  home with assist    JUSTIFICATION OF DISCHARGE RECOMMENDATION   Based on current diagnosis, functional performance prior to admission, and current functional performance, this patient requires continued OT services in Anticipated Discharge Disposition: (P) home with assist  in order to achieve significant functional improvements.    Plan:   Current Intervention:  Predicted Duration of Therapy: (P) until discharge    To provide Occupational therapy services  Therapy Frequency: (P) minimum of 1x/week  for duration of Predicted Duration of Therapy: (P) until discharge  .       The risks/benefits of therapy have been discussed with the patient/caregiver and he/she is in agreement with the established plan of care.       Subjective & Objective     MEDICAL HISTORY:   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    CPAP (continuous positive airway pressure) dependence     Depression     Fibromyalgia     GERD (gastroesophageal reflux disease)     Headache     History of heart murmur in childhood     History of palpitations     "Heart beating hard"    Hyperlipidemia      Insomnia     Neck problem     Neuropathy (CMS HCC)     Osteopenia     Prediabetes     Sleep apnea          SURGICAL HISTORY:   Past Surgical History:   Procedure Laterality Date    BREAST CYST EXCISION      HX BACK SURGERY      HX CYST INCISION AND DRAINAGE Right 1974    REMOVED CYST    HX SHOULDER SURGERY Right     HX TONSILLECTOMY      KNEE CARTILAGE SURGERY Right     SKIN CANCER EXCISION      (L) leg       ipp    INSERT FLOW SHEET     10/03/23 1530   Rehab Session   Document Type evaluation   OT Visit Date 10/03/23   Total OT Minutes: 28  (OT/PT co-eval)   Symptoms Noted During/After Treatment dizziness;nausea;increased pain   General Information   Patient Profile Reviewed yes   Medical Lines PIV Line;Telemetry   Respiratory Status nasal cannula   Existing Precautions/Restrictions fall precautions   Pre Treatment Status   Pre Treatment Patient Status Patient supine in bed;Call light within reach;Telephone within reach;Nurse approved session;Patient safety alarm activated   Support Present Pre Treatment  Family present;Other (See comments)  (PT)   Communication Pre Treatment  Nurse   Communication Pre Treatment Comment Cleared for OT   Living Environment   Lives With spouse   Living Arrangements house   Home Accessibility stairs to enter home   Living Environment Comment She has a walk-in shower, shower chair and BSC   Home Main Entrance   Number of Stairs, Main Entrance five   Functional Level Prior   Ambulation 1 - assistive equipment  (cane)   Transferring 0 - independent   Toileting 0 - independent   Bathing 0 - independent   Dressing 0 - independent   Eating 0 - independent   Communication 0 - understands/communicates without difficulty   Swallowing 0-->swallows foods/liquids without difficulty   Self-Care   Dominant Hand right   Vital Signs   Pre-Treatment Heart Rate (beats/min) 89   Post-treatment Heart Rate (beats/min) 71   Post Treatment BP 77/44   Pre SpO2 (%) 94   O2 Delivery Pre Treatment supplemental  O2   Post SpO2 (%) 96   O2 Delivery Post Treatment supplemental O2   Coping/Psychosocial   Observed Emotional State anxious   Family/Support System   Family/Support Persons spouse   Involvement in  Care at bedside;attentive to patient;interacting with patient;participating in care   Coping/Psychosocial Response Interventions   Plan Of Care Reviewed With patient;spouse   Cognition   Behavior/Mood Observations alert;cooperative;anxious   Orientation Status oriented x 4   Attention WNL/WFL   Follows Commands WFL   Vision Assessment/Interventions   Visual Impairment/Limitations WFL with corrective lenses   RUE Assessment   RUE Assessment WFL- Within Functional Limits   LUE Assessment   LUE Assessment WFL- Within Functional Limits   Grip Strength   Grip Left (4/5) good, left   Right Grip (4/5) good, right   Toileting Assessment/Training   Assistive Devices bedside commode   Position sitting   TOILETING ASSESSED Perineal hygiene   Independence Level  standby assist   Post Treatment Status   Post Treatment Patient Status Patient supine in bed;Call light within reach;Telephone within reach;Patient safety alarm activated   Support Present Post Treatment  Nurse present;Family present;Other (See comments)  (PT)   Communication Post Treatement Nurse   Care Plan Goals   OT Rehab Goals LB Dressing Goal;Toileting Goal   LB Dressing Goal   LB Dressing Goal, Date Established 10/03/23   LB Dressing Goal, Time to Achieve by discharge   LB Dressing Goal, Activity Type all lower body dressing tasks   LB Dressing Goal, Independence Level contact guard assist   LB Dressing Goal, Adaptive Equipment sock-aid;shoe horn, long handled;reacher   Toileting Goal   Toileting Goal, Date Established 10/03/23   Toileting Goal, Time to Achieve by discharge   Toileting Goal, Activity Type all toileting tasks   Toileting Goal, Independence Level independent   Planned Therapy Interventions, OT Eval   Planned Therapy Interventions ADL retraining;ROM (range  of motion)   Clinical Impression   Functional Level at Time of Session Patient is a pleasant 67 year old female admitted for an elective bilateral  TKA. Prior to admission, she was independent in ADLs, IADLs and functional mobility. During OT/PT eval, patient was alert, oriented x4, cooperative and able to follow multistep commads. She has full UB AROM and good (4/5) UB strength. Patient engaged in bed mobility and transfers with PT (supine-EOB-BSC). Patient required SBA to engage in perineal hygiene. While on [facility], patient reported lightheadedness, nausea and "feels like she will pass out". BP was checked and was 77/44. Patient was on room air when SpO2 dropped to 85. She was placed back on O2. Patient was able to transfer back to bed safely and reported that her symptoms were slightly subsiding when she laid down. Nurse was called in and was at bedside throughout. OT to follow up for ADL training, DME/AE training and functional transfer training.   Criteria for Skilled Therapeutic Interventions Met (OT) yes;meets criteria;skilled treatment is necessary   Rehab Potential good   Therapy Frequency minimum of 1x/week   Predicted Duration of Therapy until discharge   Anticipated Equipment Needs at Discharge bathing equipment;dressing equipment;reacher   Anticipated Discharge Disposition home with assist   Evaluation Complexity Justification   Occupational Profile Review Brief history   Performance Deficits 1-3 deficits   Clinical Decision Making Low analytic complexity   Evaluation Complexity Low       TREATMENT PLAN: ADL/IADL TRAINING and DME/AE TRAINING  EVALUATION COMPLEXITY: CLINICAL DECISION MAKING OF LOW COMPLEXITY AS INDICATED BY PMH, OCCUPATIONAL THERAPY ASSESSMENT OF MUSCULOSKELETAL AND NEUROLOGICAL SYSTEMS AND ACTIVITY LIMITATIONS. CLINICAL PRESENTATION IS STABLE AND UNCOMPLICATED.      EVALUATION 14 minutes    Therapist:      Hildred Laser,  OT,10/03/2023 16:42

## 2023-10-03 NOTE — Progress Notes (Signed)
Post op call:  Yehuda Mao  [phone]   No answer, no message.

## 2023-10-03 NOTE — PT Evaluation (Signed)
[facility]  [address]  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 She is independent at baseline with all ADLs and self care. She has been ambulating with SPC. Today, patient is lethargic and present with fluctuations in concsiousness. Patient requires mod A to mobilize to edge of bed. She reports severe pain and onset of dizziness. She transfers to bedside commode with mod A where she demonstrated increased difficulty to maintain level of consciousness. OT performs evaluation at bedside and assess self care on commode. Patient was clammy and reported increased body temperature with significantly decreased comfort. Vitals assessed to reveal BP of 77/44 and SpO2 decreased to 85% on RA. Nursing notified and patient returned to bed with 2L supplemental O2. Patient indicates minor improvement in symptoms while supine in bed. Patient will benefit from continued skilled PT to address endurance, pain management, decreased B LE strength, balance impairments, B LE ROM, and general  functional mobility.    Discharge Needs:    Equipment Recommendation: front wheeled walker      The patient presents with mobility limitations due to impaired balance, impaired range of motion, impaired strength, weight bearing restrictions, and impaired functional activity tolerance that significantly impair/prevent patient's ability to participate in mobility-related activities of daily living (MRADLs) including  ambulation and transfers in order to safely complete, toileting, bathing, laundering/household tasks, safely entering/exiting the home. This functional mobility deficit can be sufficiently resolved with the use of a front wheeled walker  in order to decrease the risk of falls, morbidity, and mortality in performance of these MRADLs.  Patient is able to safely use this assistive device.    Discharge Disposition: home with outpatient services, home with assist    JUSTIFICATION OF DISCHARGE RECOMMENDATION   Based on current diagnosis, functional performance prior to admission, and current functional performance, this patient requires continued PT services in home with outpatient services, home with assist in order to achieve significant functional improvements in these deficit areas: aerobic capacity/endurance, arousal, attention, and cognition, ergonomics and body mechanics, gait, locomotion, and balance, muscle performance, neuromuscular, ROM (range of motion).        Plan:  Current Intervention:    To provide physical therapy services  (1-3x/day monday-saturday)  for duration of until discharge.    The risks/benefits of therapy have been discussed with the patient/caregiver and he/she is in agreement with the established plan of care.       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    CPAP (continuous positive airway pressure) dependence     Depression     Fibromyalgia     GERD  (gastroesophageal reflux disease)     Headache     History of heart murmur in childhood     History of palpitations     "Heart beating hard"    Hyperlipidemia     Insomnia     Neck problem     Neuropathy (CMS HCC)     Osteopenia     Prediabetes     Sleep apnea             Past Surgical History:   Procedure Laterality Date    BREAST CYST EXCISION      HX BACK SURGERY      HX CYST INCISION AND DRAINAGE Right 1974    REMOVED CYST    HX SHOULDER SURGERY Right     HX TONSILLECTOMY      KNEE CARTILAGE SURGERY Right     SKIN CANCER EXCISION      (L) leg                 10/03/23 1531   Rehab Session   Document Type evaluation   PT Visit Date 10/03/23   General Information   Patient Profile Reviewed yes   Pertinent History of Current Functional Problem Ashley Reid is a 67 y/o female 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 PT orders are to evaluate and treat, and assess for post d/c PT need.   Medical Lines PIV Line   Respiratory Status nasal cannula   Existing Precautions/Restrictions fall precautions   Mutuality/Individual Preferences   Patient-Specific Goals (Include Timeframe) Discharge home   Plan of Care Reviewed With patient;spouse   Patient would like to participate in bedside shift report Yes   Living Environment   Lives With spouse   Living Arrangements house   Home Assessment: No Problems Identified   Home Accessibility stairs to enter home   Home Main Entrance   Number of Stairs, Main Entrance five   Stair Railings, Main Entrance railings on both sides of stairs   Functional Level Prior   Ambulation 1 - assistive equipment  (Cane)   Transferring 0 - independent   Toileting 0 - independent   Bathing 0 - independent   Dressing 0 - independent   Pre Treatment Status   Pre Treatment Patient Status Patient supine in bed;Call light within reach;Telephone within reach;Patient safety alarm activated;Nurse approved session   Support Present Pre Treatment  Family present;Other (See comments)  (OT)   Communication Pre Treatment  Nurse    Communication Pre Treatment Comment Cleared for PT   Cognitive Assessment/Interventions   Behavior/Mood Observations lethargic;cooperative;anxious   Orientation Status oriented x 4   Attention WNL/WFL   Follows Commands WNL   Pre- Treatment Vital Signs   Pre-Treatment Heart Rate (beats/min) 89   Pre SpO2 (%) 94   O2 Delivery Pre Treatment  supplemental O2   Vitals Comment 2L   Pre-Treatment Pain   Pretreatment Pain Rating 10/10   Pre/Posttreatment Pain Comment B knees   RLE Assessment   RLE Assessment X-Exceptions   RLE ROM   (Not formally assessed)   RLE Strength Grossly 3-/5   LLE Assessment   LLE Assessment X-Exceptions   LLE ROM   (Not formally assessed)   LLE Strength Grossly 3-/5   Trunk Assessment   Trunk Assessment WFL for stated baseline   Mobility Assessment/Training   Additional Documentation Bed Mobility Assessment/Treatment (Group);Transfer Assessment/Treatment (Group)   Bed Mobility   Impairments endurance;pain;ROM decreased;strength decreased   Bed Mobility, Assistive Device bed rails;draw sheet;Head of Bed Elevated   Safety Issues decreased use of legs for bridging/pushing   Sit to Supine, Independence moderate assist (50% patient effort)   Supine-Sit Independence moderate assist (50% patient effort)   Transfer Assessment/Treatment   Transfer Impairments endurance;pain;ROM decreased;strength decreased   Sit-Stand-Sit, Assist Device walker, front wheeled   Sit-Stand Independence moderate assist (50% patient effort)   Stand-Sit Independence moderate assist (50% patient effort)   Toilet Transfer Assist Device walker, front wheeled   Toilet Transfer Independence moderate assist (50% patient effort)   Post Treatment Status   Post Treatment Patient Status Patient supine in bed;Call light within reach;Telephone within reach;Patient safety alarm activated   Support Present Post Treatment  Nurse present;Family present;Other (See comments)  (OT)   Communication Post Treatement Nurse   Communication Post  Treatment Comment Patient status and vitals   Patient Effort fair   Post-Treatment Vital Signs   Post-treatment Heart Rate (beats/min) 71   Post SpO2 (%) 96   O2 Delivery Post Treatment supplemental O2   Post-Treatment Pain   Posttreatment Pain Rating 10/10   Physical Therapy Clinical Impression   Assessment Sennie A. 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 She is independent at baseline with all ADLs and self care. She has been ambulating with SPC. Today, patient is lethargic and present with fluctuations in concsiousness. Patient requires mod A to mobilize to edge of bed. She reports severe pain and onset of dizziness. She transfers to bedside commode with mod A where she demonstrated increased difficulty to maintain level of consciousness. OT performs evaluation at bedside and assess self care on commode. Patient was clammy and reported increased body temperature with significantly decreased comfort. Vitals assessed to reveal BP of 77/44 and SpO2 decreased to 85% on RA. Nursing notified and patient returned to bed with 2L supplemental O2. Patient indicates minor improvement in symptoms while supine in bed. Patient will benefit from continued skilled PT to address endurance, pain management, decreased B LE strength, balance impairments, B LE ROM, and general functional mobility.   Criteria for Skilled Therapeutic yes   Impairments Found (describe specific impairments) aerobic capacity/endurance;arousal, attention, and cognition;ergonomics and body mechanics;gait, locomotion, and balance;muscle performance;neuromuscular;ROM (range of motion)   Functional Limitations in Following  self-care   Rehab Potential good   Therapy Frequency   (1-3x/day monday-saturday)   Predicted Duration of Therapy Intervention (days/wks) until discharge   Anticipated Equipment Needs at Discharge (PT) front wheeled walker   Anticipated Discharge Disposition home with outpatient services;home with assist   Evaluation Complexity  Justification   Patient History: Co-morbidity/factors that impact Plan of Care 3 or more that impact Plan of Care   Examination Components 4 or more Exam elements addressed   Presentation Stable: Uncomplicated, straight-forward, problem focused   Clinical Decision Making Low complexity  Evaluation Complexity Low complexity   Physical Therapy Time and Intention   Total PT Minutes: 28  (Co-eval with OT)   (INSERT FLOWSHEET)    TOTAL KNEE ARTHROPLASTY (TKA) PHYSICAL THERAPY GOALS    1) AMBULATE 300 FEET WITH WALKER AND CGA.   2) TRANSFER BED TO CHAIR WITH CGA.   3) INCREASED STRENGTH TO 3+/5 KNEE EXTENSION.   4) INCREASED KNEE AAROM TO 4 TOP 90 DEGREES.  5) NEGOTIATES TRAINING STEPS USING HANDRAIL AND CGA.       Physical Therapy Inpatient TKA D/C Criteria        PATIENT/CAREGIVER EDUCATION  Educated on exercise program? YES  Can successfully demonstrate exercise form? YES  Can demonstrate safe/effective assistance with functional mobility? YES     IMPAIRMENT BASED CRITERIA  Does the patient demonstrate knee ROM of at least 8-75 degrees on the involved leg? COMMENT Deferred  Does the patient demonstrate a minimum of 3+/5 quadricep strength? NO  Does the patient demonstrate the ability to maintain any weight bearing precautions? NO  Does the patient demonstrated sensation of all LE dermatomes? YES  Does the patient have adequate pain control of <4/10 for functional mobility at home? NO    FUNCTIONAL BASED CRITERIA  Does that patient demonstrate the ability to ambulate 80 feet with RW using CGA/SPV assistance? NO  Does the patient demonstrate the ability to perform bed mobility with no more than min assist? NO  Does the patient demonstrated the ability to sit to stand from the bed or chair with min assist? NO  Does the patient demonstrate the ability to walk up/down 3-5 stairs with min assist as required for home entry? COMMENT Deferred    ROM  Extension Deferred  Flexion Deferred        INTERVENTION MINUTES: EVALUATION  28 minutes    EVALUATION COMPLEXITY : CLINICAL DECISION MAKING OF LOW COMPLEXITY AS INDICATED BY PMH, PHYSICAL THERAPY ASSESSMENT OF MUSCULOSKELETAL AND NEUROLOGICAL SYSTEMS AND ACTIVITY LIMITATIONS. CLINICAL PRESENTATION IS STABLE AND UNCOMPLICATED    Therapist:     Phil Dopp, PT  10/03/2023, 17:19

## 2023-10-03 NOTE — Nurses Notes (Signed)
10/03/23 1622   Vital Signs   Heart Rate 94   BP (Non-Invasive) 124/70   Oxygen Therapy   SpO2 95 %   Pulse Ox Frequency continuous   O2 Delivery Source  NC   Flow (L/min) (Oxygen Therapy) 2       Vitals rechecked and pt verbally states she still feels weak but denies any other needs or issues. Dr.Branson aware as he is at bedside.

## 2023-10-03 NOTE — Anesthesia Transfer of Care (Signed)
ANESTHESIA TRANSFER OF CARE   Ashley Reid is a 67 y.o. ,female, Weight: 84.1 kg (185 lb 8 oz)   had Procedure(s):  BILATERAL TOTAL KNEE ARTHROPLASTIES USING PRESSFIT TRIATHLON IMPLANTS  performed  10/03/23   Primary Service: Alexia Freestone, MD    Past Medical History:   Diagnosis Date   . Anxiety    . Arthritis    . Back problem    . 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 CPAP (continuous positive airway pressure) dependence    . Depression    . Fibromyalgia    . GERD (gastroesophageal reflux disease)    . Headache    . History of heart murmur in childhood    . History of palpitations     "Heart beating hard"   . Hyperlipidemia    . Insomnia    . Neck problem    . Neuropathy (CMS HCC)    . Osteopenia    . Prediabetes    . Sleep apnea       Allergy History as of 10/03/23       PENICILLINS         Noted Status Severity Type Reaction    05/03/23 1119 Delight Hoh, RN 01/05/10 Active Medium  Rash    05/17/22 1441 Mickeal Skinner, RN 05/17/22 Active Medium  Rash              DICLOFENAC         Noted Status Severity Type Reaction    05/03/23 1119 Delight Hoh, RN 05/03/23 Active    Other Adverse Reaction (Add comment)    Comments: "Chest pain"                   I completed my transfer of care / handoff to the receiving personnel during which we discussed:  Access, Airway, All key/critical aspects of case discussed, Analgesia, Antibiotics, Expectation of post procedure, Fluids/Product, Gave opportunity for questions and acknowledgement of understanding, Labs and PMHx      Post Location: PACU                                                           Last OR Temp: Temperature: 36.4 C (97.6 F)  ABG:  POTASSIUM   Date Value Ref Range Status   09/05/2023 3.9 3.5 - 5.1 mmol/L Final     KETONES   Date Value Ref Range Status   09/05/2023 Negative Negative, Trace mg/dL Final     CALCIUM   Date Value Ref Range Status   09/05/2023 10.1 8.6 - 10.3 mg/dL Final     Calculated P Axis   Date Value Ref Range Status    09/05/2023 47 degrees Final     Calculated R Axis   Date Value Ref Range Status   09/05/2023 59 degrees Final     Calculated T Axis   Date Value Ref Range Status   09/05/2023 22 degrees Final     Airway:* No LDAs found *  Blood pressure 118/70, pulse 80, temperature 36.4 C (97.6 F), resp. rate (!) 11, height 1.702 m (5\' 7" ), weight 84.1 kg (185 lb 8 oz), SpO2 100%.

## 2023-10-03 NOTE — Nurses Notes (Signed)
10/03/23 1552   Vital Signs   BP (Non-Invasive) (!) 78/54  (pt got up with therapy when this occured)   BP Source (Non-Invasive) Manual     Ashley Reid was working with therapy when she stood up pt got clammy, dizzy, and lightheaded she was then placed back in bed, pt glucose was checked and within normal limits, once patient got back in bed her blood pressure was 107/58 HR 80.

## 2023-10-03 NOTE — Anesthesia Procedure Notes (Signed)
Ashley Reid    Designer, jewellery    Performed by:   Performing Provider: Cornett, Mayme Genta, MD   Authorizing provider: Luisa Hart Mayme Genta, MD      Sedation        Blocks   Block: spinal   Type of block: single shot   Indication:primary anesthetic         Technique:  Pt location: In OR  Approach: midline          Needle level: L3-4  Skin Prep: chlorhexidine and Antiseptic wash and Sterilely prepped and draped   Preanesthesia Checklist:    Position:  Positioning: sitting   Skin Local:  Skin local: Lidocaine 1%   Spinal Needle:  Spinal needle:Whitacre    Spinal Needle gauge: 25 G    Spinal needle length: 3.5 inch   Epidural Needle:                Epidural Catheter:              Block Events:     Test dose:                Medications    bupivacaine PF 0.75%-dextrose 8.25% (MARCAINE SPINAL) spinal injection - Intrathecal   1.8 mL - 10/03/2023 7:37:00 AM  fentaNYL (SUBLIMAZE) 50 mcg/mL injection - Intrathecal   20 mcg - 10/03/2023 7:37:00 AM  Dosing          NOTES

## 2023-10-03 NOTE — OR Surgeon (Signed)
[place] [facility]  Operative Note     PATIENT NAME:  Ashley Reid, Ashley Reid  MRN:  [id number]  DOB:  Sep 15, 1956    Date of Procedure:  10/03/2023  Preoperative Diagnosis: OSTEOARTHRITIS BILATERAL KNEES   Postoperative Diagnoses:  OSTEOARTHRITIS BILATERAL KNEES   Procedure Performed: Procedure(s) (LRB):  BILATERAL TOTAL KNEE ARTHROPLASTIES USING PRESSFIT TRIATHLON IMPLANTS (Bilateral) LET KNEE  Implants:  Implant Name Type Inv. Item Serial No. Manufacturer Lot No. LRB No. Used Action   FEM 3 PA CRCTE RTN BEAD TRIATH KNEE RGT COM STRL LF - [id number]  FEM 3 PA CRCTE RTN BEAD TRIATH KNEE RGT COM STRL LF  [employer] [id number] Right 1 Implanted   INSERT TIB BRNG CRCTE RTN 3 TRIATH X3 KNEE STRL LF - [id number]  INSERT TIB BRNG CRCTE RTN 3 TRIATH X3 KNEE STRL LF  [employer] [id number] Right 1 Implanted   BASEPLATE TIB TRIATH 3 KNEE TRIT - [id number]  BASEPLATE TIB TRIATH 3 KNEE TRIT  [employer] [id number] Right 1 Implanted   PATEL  TRIT METAL ASYM TRIATH KNEE COM STRL - [id number]  PATEL  TRIT METAL ASYM TRIATH KNEE COM STRL  [employer] [id number] Right 1 Implanted   FEM 3 PA CRCTE RTN BEAD TRIATH KNEE LFT COM STRL LF - [id number]  FEM 3 PA CRCTE RTN BEAD TRIATH KNEE LFT COM STRL LF  [employer] [id number] Left 1 Implanted   INSERT TIB BRNG CRCTE RTN 3 TRIATH X3 KNEE STRL LF - [id number]  INSERT TIB BRNG CRCTE RTN 3 TRIATH X3 KNEE STRL LF  [employer] [id number] Left 1 Implanted   BASEPLATE TIB TRIATH 3 KNEE TRIT - [id number]  BASEPLATE TIB TRIATH 3 KNEE TRIT  [employer] [id number] Left 1 Implanted   PATEL  TRIT METAL ASYM TRIATH KNEE COM STRL - [id number]  PATEL  TRIT METAL ASYM TRIATH KNEE COM STRL  [employer] [id number] Left 1 Implanted        Surgeon:  Right knee: Waldron Session, MD   Left Knee:  Henriette Combs, DO  Assistant:  Right knee: Henriette Combs, DO  Left Knee: Waldron Session, MD  Anesthesia: Anesthesiologist: Cornett, Mayme Genta,  MD  CRNA: Albertine Patricia, CRNA; Floy Sabina, CRNA  Type: spinal  OR Staff: Circulator: Octavio Graves, RN  PERIOPERATIVE CARE ASSISTANT: Boykin Reaper, PCA  Scrub Person: Stephan Minister, RN; Salvadore Oxford, CST  Scrub First Assist: [place], [place], [place]; Almyra Free, ST   Anticoagulation: ASA     Antibiotics:  KEfzol  Estimated Blood Loss: Minimal  Specimens: * No specimens in log *   Complications: None immediate  Indications For Procedure:  Ashley Reid  is a very pleasant  67 y.o. female  presenting  for OSTEOARTHRITIS BILATERAL KNEES Risks, benefits, indications, and complications of procedure were discussed, and informed signed consent obtained.  DESCRIPTION OF THE PROCEDURE:  Patient identified and time out completed with entire surgical team. Antibiotics and tranexamic acid administered prior to inflation of toruniquet.  Satisfactory anesthesia induced.  Tourniquet was placed over padding on the thigh.  Both legs were prepped with DuraPrep and draped in a sterile manner.  Elevation, exsanguination, and tourniquet to 350 mmHg.     Procedure was performed with 2 separate operating teams working simultaneously.  Start time staggered between the knees to allow for the contralateral surgeon to assist, particularly at critical stages of the procedure establishing final component alignment and rotation.  Left side started  first.  Anterior approach to the knee through medial parapatellar arthrotomy.  Intramedullary femoral cutting guide, 4 degrees valgus, 8 mm resection, distal cut performed.  Sizing guide in neutral rotation, component chosen.  Anterior, posterior, and chamfer cuts performed.  ACL parted sharply.  Tibia subluxed anteriorly.  Medial release performed.  Extramedullary tibial cutting guide referencing 9 mm from the high side, perpendicular with anatomic axis, 3 degrees posterior slope.  Resection performed.  Marginal osteophytes removed from the tibia, posterior osteophytes from the femur, notch  debrided.  A baseplate trial was positioned with an insert and femur.  Initially loose in flexion, tight in extension.  Distal femoral cut revised at -2, knee balanced.  Full extension and flexion achieved with good rotation, alignment, and stability.  The four auxiliary holes drilled in the tibia.  Patellar articular surface resected; component chosen.  Drill holes performed.  Trial placed with nice tracking and good medial contact.  All trials removed.  Irrigation followed.  The baseplate was impacted into position with appropriate insert snapped into place.  The femoral component was then impacted and the patella compressed into position.  The knee was taken through range of motion and stable.  All components with nice bony contact.  Wound was irrigated copiously with Irrisept.  The arthrotomy was closed with #1 Vicryl in interrupted fashion, subcutaneous tissue, and skin in layers.  Sterile Jones bandage was applied.  Tourniquet was released.  The patient was brought to recovery room in stable condition.    Alexia Freestone, MD  10/03/2023, 08:37   This note was partially generated using MModal Fluency Direct system, and there may be some incorrect words, spellings, and punctuation that were not noted in checking the note before saving.

## 2023-10-03 NOTE — Nurses Notes (Signed)
Transported to room.  VS:  07.4, 86-20-120/81, sat 99% 2 lpm nc.

## 2023-10-03 NOTE — Anesthesia Postprocedure Evaluation (Signed)
Anesthesia Post Op Evaluation    Patient: Ashley Reid  Procedure(s):  BILATERAL TOTAL KNEE ARTHROPLASTIES USING PRESSFIT TRIATHLON IMPLANTS    Last Vitals:Temperature: 36.4 C (97.6 F) (10/03/23 0956)  Heart Rate: 92 (10/03/23 1039)  BP (Non-Invasive): 108/60 (10/03/23 1039)  Respiratory Rate: 13 (10/03/23 1039)  SpO2: 96 % (10/03/23 1039)    No notable events documented.    Patient is sufficiently recovered from the effects of anesthesia to participate in the evaluation and has returned to their pre-procedure level.  Patient location during evaluation: PACU       Patient participation: complete - patient participated  Level of consciousness: awake and alert and responsive to verbal stimuli    Pain management: adequate  Airway patency: patent    Anesthetic complications: no  Cardiovascular status: acceptable  Respiratory status: acceptable  Hydration status: acceptable  Patient post-procedure temperature: Pt Normothermic   PONV Status: Absent

## 2023-10-03 NOTE — OR Surgeon (Signed)
[place] MEDICINE [facility]  Operative Note     PATIENT NAME:  Ashley Reid, Ashley Reid  MRN:  [id number]  DOB:  01-30-1956    Date of Procedure:  10/03/2023  Preoperative Diagnosis: OSTEOARTHRITIS BILATERAL KNEES   Postoperative Diagnoses:  OSTEOARTHRITIS BILATERAL KNEES   Procedure Performed: Procedure(s) (LRB):  BILATERAL TOTAL KNEE ARTHROPLASTIES USING PRESSFIT TRIATHLON IMPLANTS (Bilateral) Right knee  Implants:  Implant Name Type Inv. Item Serial No. Manufacturer Lot No. LRB No. Used Action   FEM 3 PA CRCTE RTN BEAD TRIATH KNEE RGT COM STRL LF - [id number]  FEM 3 PA CRCTE RTN BEAD TRIATH KNEE RGT COM STRL LF  [employer] [id number] Right 1 Implanted   INSERT TIB BRNG CRCTE RTN 3 TRIATH X3 KNEE STRL LF - [id number]  INSERT TIB BRNG CRCTE RTN 3 TRIATH X3 KNEE STRL LF  [employer] [id number] Right 1 Implanted   BASEPLATE TIB TRIATH 3 KNEE TRIT - [id number]  BASEPLATE TIB TRIATH 3 KNEE TRIT  [employer] [id number] Right 1 Implanted   PATEL  TRIT METAL ASYM TRIATH KNEE COM STRL - [id number]  PATEL  TRIT METAL ASYM TRIATH KNEE COM STRL  [employer] [id number] Right 1 Implanted   FEM 3 PA CRCTE RTN BEAD TRIATH KNEE LFT COM STRL LF - [id number]  FEM 3 PA CRCTE RTN BEAD TRIATH KNEE LFT COM STRL LF  [employer] [id number] Left 1 Implanted   INSERT TIB BRNG CRCTE RTN 3 TRIATH X3 KNEE STRL LF - [id number]  INSERT TIB BRNG CRCTE RTN 3 TRIATH X3 KNEE STRL LF  [employer] [id number] Left 1 Implanted   BASEPLATE TIB TRIATH 3 KNEE TRIT - [id number]  BASEPLATE TIB TRIATH 3 KNEE TRIT  [employer] [id number] Left 1 Implanted   PATEL  TRIT METAL ASYM TRIATH KNEE COM STRL - [id number]  PATEL  TRIT METAL ASYM TRIATH KNEE COM STRL  [employer] [id number] Left 1 Implanted        Surgeon:  Right knee: Waldron Session, MD   Left Knee:  Henriette Combs, DO  Assistant:  Right knee: Henriette Combs, DO  Left Knee: Waldron Session, MD  Anesthesia: Anesthesiologist: Cornett, Mayme Genta, MD  CRNA: Albertine Patricia, CRNA; Floy Sabina, CRNA  Type: spinal  OR Staff: Circulator: Octavio Graves, RN  PERIOPERATIVE CARE ASSISTANT: Boykin Reaper, PCA  Scrub Person: Stephan Minister, RN; Salvadore Oxford, CST  Scrub First Assist: [place], [place], [place]; Almyra Free, ST   Anticoagulation: ASA     Antibiotics:  Kefzol  Estimated Blood Loss: Minimal  Specimens: * No specimens in log *   Complications: None immediate  Indications For Procedure:  Ashley Reid  is a very pleasant  67 y.o. female  presenting  for OSTEOARTHRITIS BILATERAL KNEES Risks, benefits, indications, and complications of procedure were discussed, and informed signed consent obtained.  DESCRIPTION OF THE PROCEDURE:  Patient identified and time out completed with entire surgical team. Antibiotics and tranexamic acid administered prior to inflation of toruniquet.  Satisfactory anesthesia induced.  Tourniquet was placed over padding on the thigh.  Both legs were prepped with DuraPrep and draped in a sterile manner.  Elevation, exsanguination, and tourniquet to 350 mmHg.     Procedure was performed with 2 separate operating teams working simultaneously.  Start time staggered between the knees to allow for the contralateral surgeon to assist, particularly at critical stages of the procedure establishing final component alignment and rotation.  Left sidstarted first.  Anterior approach to the knee through medial parapatellar arthrotomy.  Intramedullary femoral cutting guide, 4 degrees valgus, 8 mm resection, distal cut performed.  Soft bone. Sizing guide in neutral rotation, component chosen.  Anterior, posterior, and chamfer cuts performed.  ACL parted sharply.  Tibia subluxed anteriorly.  Medial release performed.  Extramedullary tibial cutting guide referencing 9 mm from the high side, perpendicular with anatomic axis, 3 degrees posterior slope.  Resection performed.  Marginal osteophytes removed from the tibia, posterior osteophytes from the  femur, notch debrided.  A baseplate trial was positioned with an insert and femur.  Full extension and flexion achieved with good rotation, alignment, and stability.  The four auxiliary holes drilled in the tibia.  Patellar articular surface resected; component chosen.  Drill holes performed.  Trial placed with nice tracking and good medial contact.  All trials removed.  Irrigation followed.  The baseplate was impacted into position with appropriate insert snapped into place.  The femoral component was then impacted and the patella compressed into position.  The knee was taken through range of motion and stable.  All components with nice bony contact.  Wound was irrigated copiously with Irrisept.  The arthrotomy was closed with #1 Vicryl in interrupted fashion, subcutaneous tissue, and skin in layers.  Sterile Jones bandage was applied.  Tourniquet was released.  The patient was brought to recovery room in stable condition.    Alexia Freestone, MD  10/03/2023, 08:34   This note was partially generated using MModal Fluency Direct system, and there may be some incorrect words, spellings, and punctuation that were not noted in checking the note before saving.

## 2023-10-04 LAB — CBC WITH DIFF
BASOPHIL #: 0 10*3/uL (ref 0.00–0.10)
BASOPHIL %: 0 % (ref 0–1)
EOSINOPHIL #: 0 10*3/uL (ref 0.00–0.50)
EOSINOPHIL %: 0 % — ABNORMAL LOW (ref 1–7)
HCT: 32.9 % (ref 31.2–41.9)
HGB: 11.4 g/dL (ref 10.9–14.3)
LYMPHOCYTE #: 0.5 10*3/uL — ABNORMAL LOW (ref 1.00–3.00)
LYMPHOCYTE %: 6 % — ABNORMAL LOW (ref 16–44)
MCH: 30.4 pg (ref 24.7–32.8)
MCHC: 34.7 g/dL (ref 32.3–35.6)
MCV: 87.5 fL (ref 75.5–95.3)
MONOCYTE #: 0.7 10*3/uL (ref 0.30–1.00)
MONOCYTE %: 7 % (ref 5–13)
MPV: 9 fL (ref 7.9–10.8)
NEUTROPHIL #: 8.7 10*3/uL — ABNORMAL HIGH (ref 1.85–7.80)
NEUTROPHIL %: 88 % — ABNORMAL HIGH (ref 43–77)
PLATELETS: 203 10*3/uL (ref 140–440)
RBC: 3.76 10*6/uL (ref 3.63–4.92)
RDW: 14 % (ref 12.3–17.7)
WBC: 9.9 10*3/uL (ref 3.8–11.8)

## 2023-10-04 LAB — BASIC METABOLIC PANEL
ANION GAP: 7 mmol/L (ref 4–13)
BUN/CREA RATIO: 19 (ref 6–22)
BUN: 12 mg/dL (ref 7–25)
CALCIUM: 9.4 mg/dL (ref 8.6–10.3)
CHLORIDE: 104 mmol/L (ref 98–107)
CO2 TOTAL: 27 mmol/L (ref 21–31)
CREATININE: 0.64 mg/dL (ref 0.60–1.30)
ESTIMATED GFR: 97 mL/min/{1.73_m2} (ref >59)
GLUCOSE: 160 mg/dL — ABNORMAL HIGH (ref 74–109)
OSMOLALITY, CALCULATED: 279 mosm/kg (ref 270–290)
POTASSIUM: 4.3 mmol/L (ref 3.5–5.1)
SODIUM: 138 mmol/L (ref 136–145)

## 2023-10-04 NOTE — Care Management Notes (Signed)
[facility]  Care Management Initial Evaluation    Patient Name: Ashley Reid  Date of Birth: 1956-09-10  Sex: female  Date/Time of Admission: 10/03/2023  5:30 AM  Room/Bed: 379/A  Payor: HU[employer] MEDICARE / Plan: Francine Graven MEDICARE ADV PEIA / Product Type: PPO /   Primary Care Providers:  Earlean Shawl, DO, DO (General)    Pharmacy Info:   Preferred Pharmacy       [facility] - [place], [place] - [address]    [address] [place] [address]    Phone: [phone] Fax: [phone]    Hours: Not open 24 hours    [facility] [place], [place] - [address]    [address] [place] [address]    Phone: [phone] Fax: [phone]    Hours: Not open 24 hours          Emergency Contact Info:   Extended Emergency Contact Information  Primary Emergency Contact: Ober,WILLARD  Address: [address]           [place], [place] [address] [place]  Home Phone: [phone]  Mobile Phone: [phone]  Relation: Husband  Interpreter needed? No    History:   Ashley Reid is a 67 y.o., female, admitted for bilateral knee arthroplasty on 10/03/2023.    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 No   Insurance Information/Type   Insurance type Medicare   Employment/Financial   Patient has Prescription Coverage?  Yes        Name of Insurance Coverage for Medications [employer] medicare   Financial/Environmental Concerns none   Living Environment   Select an age group to open "lives with" row.  Adult   Lives With spouse   Living Arrangements house   Able to Return to Prior Arrangements yes   Home Safety   Home Assessment: No Problems Identified   Home Accessibility stairs to enter home   Custody and Legal  Status   Do you have a court appointed guardian/conservator? No   Are you an emancipated minor? No   Custody Issues? No   Paternity Affidavit Requested? No   Care Management Plan   Discharge Planning Status initial meeting   Projected Discharge Date 10/04/23   Discharge plan discussed with: Spouse   CM will evaluate for rehabilitation potential no   Discharge Needs Assessment   Equipment Needed After Discharge none   Discharge Facility/Level of Care Needs Home (Patient/Family Member/other)(code 1)   Transportation Available car;family or friend will provide   Referral Information   Admission Type observation   Address Verified verified-no changes   Arrived From home or self-care   ADVANCE DIRECTIVES   Does the Patient have an Advance Directive? No, Information Offered and Refused   Mutuality/Individual Preferences    Patient-Specific Goals (Include Timeframe) To get some sleep   Home Main Entrance   Stair Railings, Main Entrance railings on both sides of stairs   Number of Stairs, Main Entrance five     CM initial discharge assessment. Patient is alert and oriented x4, accompanied by her husband and 2  brothers. Patient reports having 5 steps to enter the house and already has a walker and bedside commode. Her plan is to return home and has outpatient PT scheduled with [facility] on 10/05/23 at 15:45. CM verified with [facility] PT date and time as well as an order. They report not needed anything from CM for patient appointment, order is linked and they have access to it.  She is good to go.     Discharge Plan:  Home (Patient/Family Member/other) (code 1)      The patient will continue to be evaluated for developing discharge needs.     Case Manager: Jeannine Kitten, RN  Phone: [phone]

## 2023-10-04 NOTE — PT Treatment (Signed)
[facility]  [address]  [place], [address]  [phone]  (Fax) [phone]  Rehabilitation Department  Physical Therapy Daily Inpatient TKA Note    Date: 10/04/2023  Patient's Name: Ashley Reid  Date of Birth: 09/09/1956  Height: Height: 170.2 cm (5' 7.01")  Weight: Weight: 84.2 kg (185 lb 10 oz)      Plan: Will continue under current POC.         Subjective/Objective/Assessment:  Flowsheet    10/04/23 0808   Rehab Session   Document Type therapy progress note (daily note)   PT Visit Date 10/04/23   General Information   Patient Profile Reviewed yes   Medical Lines PIV Line   Respiratory Status room air   Existing Precautions/Restrictions fall precautions   Pre Treatment Status   Pre Treatment Patient Status Patient sitting in bedside chair or w/c   Support Present Pre Treatment  Family present   Programmer, multimedia Nurse   Communication Pre Treatment Comment cleared for PT   Pre-Treatment Pain   Pretreatment Pain Rating 8/10   Pre/Posttreatment Pain Comment both knees   Transfer Assessment/Treatment   Sit-Stand Independence moderate assist (50% patient effort);2 person assist required   Stand-Sit Independence moderate assist (50% patient effort);2 person assist required   Sit-Stand-Sit, Assist Device walker, front wheeled   Transfer Impairments endurance;pain;strength decreased   Gait Assessment/Treatment   Total Distance Ambulated 180   Independence  contact guard assist   Assistive Device  walker, front wheeled   Impairments  endurance;pain;strength decreased   Comment 3 point gait, no lob, standing rests, encouragment needed to walk   Stairs Assessment/Treatment   Number of Stairs 4   Handrail Location both sides   Independence Level contact guard assist   Balance   Sitting Balance: Static good balance   Sitting, Dynamic (Balance) fair + balance   Sit-to-Stand Balance fair balance   Standing Balance: Static fair balance   Standing Balance: Dynamic  fair - balance   Therapeutic Exercise   Comment patient particpated with group therapy, did well with all exs, reviewed walking, icing, and positioning to prevent excess swelling, all questions answered for home, family present during session, lots of encouragment needed to getpatient to particpate with exs   Post Treatment Status   Post Treatment Patient Status Patient sitting in bedside chair or w/c;Patient safety alarm activated   Support Present Post Treatment  Family present   Communication Post Treatement Nurse;Charge Nurse   Patient Effort good   Post-Treatment Pain   Posttreatment Pain Rating 7/10   Cognitive Assessment/Intervention   Behavior/Mood Observations anxious;alert;cooperative   RLE Assessment   Right Knee Flexion 75   Right Knee Extensor Lag 7   LLE Assessment   Left Knee Flexion 84   Left Knee Extensor Lag 5   Physical Therapy Time and Intention   Total PT Minutes: 75   Therapy Plan Review/Discharge Plan (PT)   Anticipated Discharge Disposition home with assist;home with outpatient services     Patient stood from the chair with mod assist, gaited with rw 130ft, no lob, couple standing rests, did well with ex program for home, lots of encouragment needed to walk and do exs, husband present, patient stood from the chair after exs with min of 1, left up in the chair with need isn reach, chair alarmed...          Physical Therapy Inpatient TKA D/C Criteria  PATIENT/CAREGIVER EDUCATION  Educated on exercise program? YES  Can successfully demonstrate exercise form? YES  Can demonstrate safe/effective assistance with functional mobility? YES     IMPAIRMENT BASED CRITERIA  Does the patient demonstrate knee ROM of at least 8-75 degrees on the involved leg? YES  Does the patient demonstrate a minimum of 3+/5 quadricep strength? NO  Does the patient demonstrate the ability to maintain any weight bearing precautions? YES  Does the patient demonstrated sensation of all LE dermatomes? YES  Does the  patient have adequate pain control of <4/10 for functional mobility at home? NO    FUNCTIONAL BASED CRITERIA  Does that patient demonstrate the ability to ambulate 80 feet with RW using CGA/SPV assistance? YES  Does the patient demonstrate the ability to perform bed mobility with no more than min assist? YES  Does the patient demonstrated the ability to sit to stand from the bed or chair with min assist? YES  Does the patient demonstrate the ability to walk up/down 3-5 stairs with min assist as required for home entry? YES    ROM  Extension left 5 and right 7  Flexion left 84 and right 75      THERAPIST  Lane Hacker, PTA  10/04/2023, 11:41       Intervention minutes: GROUP THERAPEUTIC EXERCISE 50 MINUTES and GAIT TRAINING 25 MINUTES    THERAPIST  Lane Hacker, PTA  10/04/2023, 11:41

## 2023-10-04 NOTE — OT Treatment (Signed)
[facility]  [address]  [place], [address]  [phone]  (Fax) [phone]  Rehabilitation Department     Occupational Therapy Daily Inpatient Note    Date: 10/04/2023  Patient's Name: Ashley Reid  Date of Birth: 08/28/1956  Height: Height: 170.2 cm (5' 7.01")  Weight: Weight: 84.2 kg (185 lb 10 oz)        Plan: Will continue under current POC.         Subjective/Objective/Assessment:      10/04/23 0954   Rehab Session   Document Type therapy progress note (daily note)   OT Visit Date 10/04/23   Total OT Minutes: 13   Patient Effort fair   Symptoms Noted During/After Treatment increased pain   General Information   Patient Profile Reviewed yes   Medical Lines PIV Line   Respiratory Status room air   Existing Precautions/Restrictions fall precautions   Pre Treatment Status   Pre Treatment Patient Status Patient sitting in bedside chair or w/c;Call light within reach;Patient safety alarm activated   Support Present Pre Treatment  Family present   Communication Pre Treatment  Charge Nurse   Communication Pre Treatment Comment Clea4r for OT   Vital Signs   Vitals Comment patient not connected to pulse ox   Pain Assessment   Pretreatment Pain Rating 9/10   Posttreatment Pain Rating 9/10   Cognition   Behavior/Mood Observations behavior appropriate to situation, WNL/WFL   Lower Body Dressing Assessment/Training   Assistive Devices reacher;sock-aid;long-handled shoe horn   DRESSING ASSESSED Doff Socks;Rada Hay;Sheldon Silvan;Doff Shoes   ASSISTANCE REQUIRED DONNING Right shoe fasten;Left shoe fasten   Independence Level  moderate assist (50% patient effort);maximum assist (25% patient effort)   Impairments pain   Post Treatment Status   Post Treatment Patient Status Patient sitting in bedside chair or w/c;Call light within reach;Patient safety alarm activated   Support Present Post Treatment  None   Clinical Impression   Functional Level at Time of Session patient educated on  ADL kit for LB dressing patient requiring extra time and max A and verbal do don/dof socks and shoes. patient crying out in pain with all movment even small movement. patient left in bedside chair wiht needs within reach and safety alarm on.                 Intervention minutes: ADL/IADL TRAINING 13 minutes      THERAPIST  Christ Kick, COTA  10/04/2023, 12:59

## 2023-10-04 NOTE — Discharge Instructions (Signed)
Orthopaedic Discharge Instructions:    Weightbear as tolerated to the operative extremity with use of an assistive device as needed upon ambulation    Maintain surgical dressing until postoperative day 7.  May remove dressing at this time and leave incision open to air if dry.  If there is persistent drainage please cover with a gauze dressing and change daily. Please call the clinic with any erythema or purulent drainage concerning for infection.    May shower, no tub baths until follow-up     Take pain medication and blood thinners as prescribed. Attend physical therapy as instructed.    Follow-up in the orthopedics clinic at your scheduled appointment in approximately 2-3 weeks.  Please call the office to confirm your appointment.  Please also call with any questions or concerns.      [facility]  [address], [place], [place] [address]  [phone]

## 2023-10-04 NOTE — Discharge Summary (Signed)
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        Details   aspirin 81 mg Tablet, Delayed Release (E.C.)  Commonly known as: ECOTRIN   81 mg, Oral, 2 TIMES DAILY, X 6 weeks  Refills: 0     HYDROcodone-acetaminophen 7.5-325 mg Tablet  Commonly known as: NORCO   1 Tablet, Oral, EVERY 6 HOURS PRN  Qty: 35 Tablet  Refills: 0            CONTINUE these medications - NO CHANGES were made during your visit.        Details   biotin 1 mg Capsule   Oral  Refills: 0     carvediloL 3.125 mg Tablet  Commonly known as: COREG   3.125 mg, Oral, 2 TIMES DAILY  Refills: 0     cyanocobalamin 1,000 mcg Tablet  Commonly known as: VITAMIN B 12   1,000 mcg, Oral, DAILY  Refills: 0     escitalopram oxalate 10 mg Tablet  Commonly known as: LEXAPRO   10 mg, Oral, DAILY  Refills: 0     famotidine 40 mg Tablet  Commonly known as: PEPCID   40 mg, Oral, 2 TIMES DAILY  Refills: 0     lansoprazole 30 mg Capsule, Delayed Release(E.C.)  Commonly known as: PREVACID   30 mg, Oral, DAILY  Refills: 0     LORazepam 1 mg Tablet  Commonly known as: ATIVAN   1 mg, Oral, NIGHTLY  Refills: 0     methocarbamoL 750 mg Tablet  Commonly known as: ROBAXIN   750 mg, Oral, EVERY 12 HOURS PRN  Refills: 0     Neurontin 600 mg Tablet  Generic drug: gabapentin   600 mg, Oral, 3 TIMES DAILY  Refills: 0     simvastatin 20 mg Tablet  Commonly known as: ZOCOR   20 mg, Oral, EVERY EVENING  Refills: 0            STOP taking these medications.      CeleBREX 200 mg Capsule  Generic drug: celecoxib     traMADoL 50 mg  Tablet  Commonly known as: ULTRAM     Tylenol Arthritis Pain 650 mg Tablet Sustained Release  Generic drug: acetaminophen              DISCHARGE INSTRUCTIONS:     Weight bear as tolerated to the operative extremity with use of an assistive device as needed upon ambulation    Maintain surgical dressing until postoperative day 7.  May remove dressing at this time and leave incision open to air if dry.  If there is persistent drainage please cover with a gauze dressing and change daily. Please call the clinic with any erythema or purulent drainage concerning for infection.    May shower, no tub baths until follow-up     Take pain medication and blood thinners as prescribed. Attend physical therapy  as instructed.    Follow-up in the orthopedics clinic at your scheduled appointment in approximately 2-3 weeks.  Please call the office to confirm your appointment.  Please also call with any questions or concerns.    REASON FOR HOSPITALIZATION AND HOSPITAL COURSE:      Ashley Reid is a 67 y.o.female that I have been following as an outpatient secondary to bilateral knee arthritis.   she went on to fail conservative treatment of their arthritic joint and elected to proceed with a total joint replacement.  she gained preop medical clearance.  We instructed him on the risk and benefits of the surgery, reviewed the consent in its entirety, we reviewed the hospital course including interoperative and postoperative goals.  she arrived on date of procedure.  her  interoperative procedure was uncomplicated.  Patient was admitted postoperatively and remained stable through postoperative course.  Placed on appropriate DVT prophylaxis. They progressed well with physical therapy and will be discharged to home.  Patient will need a walker for initial ambulation to assist with correcting gait form and function.  Patient will be confined to one room and will therefore require bedside commode upon discharge. We have educated the patient  on the discharge process,  reviewed med reconciliation, and once again reviewed postoperative goals.  Patient was educated on dressing instructions and care.  We have reminded the patient through education the importance of a healthy diet during the perioperative period.  Questions were encouraged and answered.        RADIOLOGY:           CONSULTATIONS:      None     PROCEDURES PERFORMED:      bilateral Total knee Replacement    CONDITION ON DISCHARGE:     Stable    DISCHARGE DISPOSITION:      Home    FOLLOW UP VISITS:    - Dr. Lindwood Qua at scheduled appointment; call [facility] clinic to confirm or with questions/concerns.  - It is recommended you follow up with your PCP within 2 weeks to review any medical changes made during this hospitalization.    Rufina Falco, D.O.  10/04/23 07:45  [facility]  Please call with any questions/concerns     This note has been created with voice recognition software.  Please excuse any errors in transcription.  Occasional wrong word or sound alike substitutions may have occurred due to the inherent limitations of voice recognition software.  Please read the chart carefully and recognize using context with the substitutions may have occurred.

## 2023-10-04 NOTE — Nurses Notes (Signed)
Drsg removed to both knees dry and intact  the patient refused to ambulate this shift.left knee measuring 19/18/14.right knee measuring 20/17/14.

## 2023-10-04 NOTE — Nurses Notes (Signed)
Discharge instructions given to patient. Patient verbalized understanding. IV access removed. Catheter intact. No active bleeding noted. Pressure dressing applied. All personal belongings packed. Nozin given along with discharge instructions. Patient to be wheeled down to personal awaiting vehicle.

## 2023-10-04 NOTE — PT Treatment (Signed)
[facility]  [address]  [place], [address]  [phone]  (Fax) [phone]  Rehabilitation Department  Physical Therapy Daily Inpatient TKA Note    Date: 10/04/2023  Patient's Name: Ashley Reid  Date of Birth: May 24, 1956  Height: Height: 170.2 cm (5' 7.01")  Weight: Weight: 84.2 kg (185 lb 10 oz)      Plan: Will continue under current POC.         Subjective/Objective/Assessment:  Flowsheet    10/04/23 1302   Rehab Session   Document Type therapy progress note (daily note)   PT Visit Date 10/04/23   General Information   Patient Profile Reviewed yes   Medical Lines PIV Line   Respiratory Status room air   Existing Precautions/Restrictions fall precautions   Pre Treatment Status   Pre Treatment Patient Status Patient sitting in bedside chair or w/c   Support Present Pre Treatment  Family present   Communication Pre Treatment  Charge Nurse   Communication Pre Treatment Comment cleared for PT   Pre-Treatment Pain   Pretreatment Pain Rating 8/10   Pre/Posttreatment Pain Comment both knees   Transfer Assessment/Treatment   Sit-Stand Independence minimum assist (75% patient effort)   Stand-Sit Independence minimum assist (75% patient effort)   Sit-Stand-Sit, Assist Device walker, front wheeled   Transfer Impairments endurance;pain;strength decreased   Gait Assessment/Treatment   Total Distance Ambulated 180   Independence  contact guard assist   Assistive Device  walker, front wheeled   Impairments  endurance;pain;strength decreased   Comment 3 point gait, no lob, no standing rests   Balance   Sitting Balance: Static good balance   Sitting, Dynamic (Balance) good balance   Sit-to-Stand Balance fair + balance   Standing Balance: Static fair + balance   Standing Balance: Dynamic fair balance   Post Treatment Status   Post Treatment Patient Status Patient sitting in bedside chair or w/c;Patient safety alarm activated   Support Present Post Treatment  Family present   Patient  Effort good   Post-Treatment Pain   Posttreatment Pain Rating 7/10   Cognitive Assessment/Intervention   Behavior/Mood Observations behavior appropriate to situation, WNL/WFL   RLE Assessment   Right Knee Flexion 75   Right Knee Extensor Lag 7   LLE Assessment   Left Knee Flexion 84   Left Knee Extensor Lag 5   Physical Therapy Time and Intention   Total PT Minutes: 12   Therapy Plan Review/Discharge Plan (PT)   Anticipated Discharge Disposition home with assist;home with outpatient services     Patient stood with min of 1, gaited with rw 127ft,  no lob,  3 point gait, returned to the chair with need sin reach, chair alrmed, getting ready to go home          Physical Therapy Inpatient TKA D/C Criteria        PATIENT/CAREGIVER EDUCATION  Educated on exercise program? YES  Can successfully demonstrate exercise form? YES  Can demonstrate safe/effective assistance with functional mobility? YES     IMPAIRMENT BASED CRITERIA  Does the patient demonstrate knee ROM of at least 8-75 degrees on the involved leg? YES  Does the patient demonstrate a minimum of 3+/5 quadricep strength? NO  Does the patient demonstrate the ability to maintain any weight bearing precautions? YES  Does the patient demonstrated sensation of all LE dermatomes? YES  Does the patient have adequate pain control of <4/10 for functional mobility at home? NO    FUNCTIONAL  BASED CRITERIA  Does that patient demonstrate the ability to ambulate 80 feet with RW using CGA/SPV assistance? YES  Does the patient demonstrate the ability to perform bed mobility with no more than min assist? YES  Does the patient demonstrated the ability to sit to stand from the bed or chair with min assist? YES  Does the patient demonstrate the ability to walk up/down 3-5 stairs with min assist as required for home entry? YES    ROM  Extension left 5 right 7  Flexion left 84 right [address]      THERAPIST  Lane Hacker, PTA  10/04/2023, 14:27       Intervention minutes: GAIT TRAINING [address]  MINUTES    THERAPIST  Lane Hacker, PTA  10/04/2023, 14:27

## 2023-10-05 ENCOUNTER — Ambulatory Visit (HOSPITAL_COMMUNITY): Payer: Self-pay

## 2023-10-05 ENCOUNTER — Inpatient Hospital Stay (HOSPITAL_COMMUNITY): Payer: Medicare Other | Admitting: Internal Medicine

## 2023-10-05 ENCOUNTER — Inpatient Hospital Stay
Admission: EM | Admit: 2023-10-05 | Discharge: 2023-10-07 | DRG: 462 | Disposition: A | Discharge status: Home / Self Care | Payer: Medicare Other | Attending: Internal Medicine | Admitting: Internal Medicine

## 2023-10-05 ENCOUNTER — Other Ambulatory Visit: Payer: Self-pay

## 2023-10-05 ENCOUNTER — Encounter (HOSPITAL_COMMUNITY): Payer: Self-pay

## 2023-10-05 ENCOUNTER — Emergency Department (HOSPITAL_COMMUNITY): Payer: Medicare Other

## 2023-10-05 DIAGNOSIS — Z9889 Other specified postprocedural states: Secondary | ICD-10-CM

## 2023-10-05 DIAGNOSIS — M79604 Pain in right leg: Secondary | ICD-10-CM

## 2023-10-05 DIAGNOSIS — M25562 Pain in left knee: Secondary | ICD-10-CM

## 2023-10-05 DIAGNOSIS — Z96653 Presence of artificial knee joint, bilateral: Principal | ICD-10-CM | POA: Diagnosis present

## 2023-10-05 DIAGNOSIS — Z7982 Long term (current) use of aspirin: Secondary | ICD-10-CM

## 2023-10-05 DIAGNOSIS — E785 Hyperlipidemia, unspecified: Secondary | ICD-10-CM | POA: Diagnosis present

## 2023-10-05 DIAGNOSIS — Z880 Allergy status to penicillin: Secondary | ICD-10-CM

## 2023-10-05 DIAGNOSIS — M25561 Pain in right knee: Secondary | ICD-10-CM

## 2023-10-05 DIAGNOSIS — G4733 Obstructive sleep apnea (adult) (pediatric): Secondary | ICD-10-CM | POA: Diagnosis present

## 2023-10-05 DIAGNOSIS — R7982 Elevated C-reactive protein (CRP): Secondary | ICD-10-CM | POA: Diagnosis present

## 2023-10-05 DIAGNOSIS — Z85828 Personal history of other malignant neoplasm of skin: Secondary | ICD-10-CM

## 2023-10-05 DIAGNOSIS — B49 Unspecified mycosis: Secondary | ICD-10-CM

## 2023-10-05 DIAGNOSIS — T8484XA Pain due to internal orthopedic prosthetic devices, implants and grafts, initial encounter: Principal | ICD-10-CM | POA: Diagnosis present

## 2023-10-05 DIAGNOSIS — M79605 Pain in left leg: Secondary | ICD-10-CM

## 2023-10-05 DIAGNOSIS — R7303 Prediabetes: Secondary | ICD-10-CM | POA: Diagnosis present

## 2023-10-05 DIAGNOSIS — F419 Anxiety disorder, unspecified: Secondary | ICD-10-CM | POA: Diagnosis present

## 2023-10-05 DIAGNOSIS — R410 Disorientation, unspecified: Secondary | ICD-10-CM

## 2023-10-05 DIAGNOSIS — F32A Depression, unspecified: Secondary | ICD-10-CM | POA: Diagnosis present

## 2023-10-05 DIAGNOSIS — G8918 Other acute postprocedural pain: Secondary | ICD-10-CM

## 2023-10-05 DIAGNOSIS — G9341 Metabolic encephalopathy: Secondary | ICD-10-CM

## 2023-10-05 DIAGNOSIS — M170 Bilateral primary osteoarthritis of knee: Secondary | ICD-10-CM | POA: Diagnosis present

## 2023-10-05 DIAGNOSIS — Z79899 Other long term (current) drug therapy: Secondary | ICD-10-CM

## 2023-10-05 LAB — COMPREHENSIVE METABOLIC PANEL, NON-FASTING
ALBUMIN/GLOBULIN RATIO: 1.7 — ABNORMAL HIGH (ref 0.8–1.4)
ALBUMIN: 3.4 g/dL — ABNORMAL LOW (ref 3.5–5.7)
ALKALINE PHOSPHATASE: 48 U/L (ref 34–104)
ALT (SGPT): 13 U/L (ref 7–52)
ANION GAP: 4 mmol/L (ref 4–13)
AST (SGOT): 13 U/L (ref 13–39)
BILIRUBIN TOTAL: 0.4 mg/dL (ref 0.3–1.0)
BUN/CREA RATIO: 33 — ABNORMAL HIGH (ref 6–22)
BUN: 20 mg/dL (ref 7–25)
CALCIUM, CORRECTED: 9.1 mg/dL (ref 8.9–10.8)
CALCIUM: 8.6 mg/dL (ref 8.6–10.3)
CHLORIDE: 107 mmol/L (ref 98–107)
CO2 TOTAL: 29 mmol/L (ref 21–31)
CREATININE: 0.61 mg/dL (ref 0.60–1.30)
ESTIMATED GFR: 98 mL/min/{1.73_m2} (ref >59)
GLOBULIN: 2 (ref 2.0–3.5)
GLUCOSE: 154 mg/dL — ABNORMAL HIGH (ref 74–109)
OSMOLALITY, CALCULATED: 285 mosm/kg (ref 270–290)
POTASSIUM: 3.2 mmol/L — ABNORMAL LOW (ref 3.5–5.1)
PROTEIN TOTAL: 5.4 g/dL — ABNORMAL LOW (ref 6.4–8.9)
SODIUM: 140 mmol/L (ref 136–145)

## 2023-10-05 LAB — URINALYSIS, MACROSCOPIC
BILIRUBIN: NEGATIVE mg/dL
BLOOD: NEGATIVE mg/dL
GLUCOSE: NEGATIVE mg/dL
KETONES: NEGATIVE mg/dL
LEUKOCYTES: NEGATIVE WBCs/uL
NITRITE: NEGATIVE
PH: 6 (ref 5.0–9.0)
PROTEIN: NEGATIVE mg/dL
SPECIFIC GRAVITY: 1.02 (ref 1.002–1.030)
UROBILINOGEN: NORMAL mg/dL

## 2023-10-05 LAB — CBC WITH DIFF
BASOPHIL #: 0 10*3/uL (ref 0.00–0.10)
BASOPHIL %: 0 % (ref 0–1)
EOSINOPHIL #: 0 10*3/uL (ref 0.00–0.50)
EOSINOPHIL %: 0 % — ABNORMAL LOW (ref 1–7)
HCT: 29.2 % — ABNORMAL LOW (ref 31.2–41.9)
HGB: 9.9 g/dL — ABNORMAL LOW (ref 10.9–14.3)
LYMPHOCYTE #: 1.7 10*3/uL (ref 1.00–3.00)
LYMPHOCYTE %: 18 % (ref 16–44)
MCH: 30 pg (ref 24.7–32.8)
MCHC: 34 g/dL (ref 32.3–35.6)
MCV: 88.3 fL (ref 75.5–95.3)
MONOCYTE #: 1 10*3/uL (ref 0.30–1.00)
MONOCYTE %: 11 % (ref 5–13)
MPV: 8.9 fL (ref 7.9–10.8)
NEUTROPHIL #: 6.4 10*3/uL (ref 1.85–7.80)
NEUTROPHIL %: 71 % (ref 43–77)
PLATELETS: 203 10*3/uL (ref 140–440)
RBC: 3.31 10*6/uL — ABNORMAL LOW (ref 3.63–4.92)
RDW: 14.6 % (ref 12.3–17.7)
WBC: 9.1 10*3/uL (ref 3.8–11.8)

## 2023-10-05 LAB — BLUE TOP TUBE

## 2023-10-05 LAB — URINALYSIS, MICROSCOPIC
RBCS: 1 /[HPF] (ref <4)
SQUAMOUS EPITHELIAL: <1 /[HPF] (ref <28)
WBCS: 1 /[HPF] (ref <6)

## 2023-10-05 LAB — GRAY TOP TUBE

## 2023-10-05 LAB — GOLD TOP TUBE

## 2023-10-05 LAB — C-REACTIVE PROTEIN(CRP),INFLAMMATION: C-REACTIVE PROTEIN (CRP): 8 mg/dL — ABNORMAL HIGH (ref 0.1–0.5)

## 2023-10-05 MED ORDER — ONDANSETRON HCL (PF) 4 MG/2 ML INJECTION SOLUTION
INTRAMUSCULAR | Status: AC
Start: 2023-10-05 — End: 2023-10-05
  Filled 2023-10-05: qty 2

## 2023-10-05 MED ORDER — HYDROMORPHONE 2 MG/ML INJECTION WRAPPER
INJECTION | INTRAMUSCULAR | Status: AC
Start: 2023-10-05 — End: 2023-10-05
  Filled 2023-10-05: qty 1

## 2023-10-05 MED ORDER — ACETAMINOPHEN 325 MG TABLET
650.0000 mg | ORAL_TABLET | Freq: Four times a day (QID) | ORAL | Status: DC | PRN
Start: 2023-10-05 — End: 2023-10-07
  Administered 2023-10-06: 650 mg via ORAL
  Filled 2023-10-05: qty 2

## 2023-10-05 MED ORDER — GABAPENTIN 600 MG TABLET
600.0000 mg | ORAL_TABLET | Freq: Three times a day (TID) | ORAL | Status: DC
Start: 2023-10-05 — End: 2023-10-07
  Administered 2023-10-05 – 2023-10-06 (×3): 600 mg via ORAL
  Administered 2023-10-06: 0 mg via ORAL
  Administered 2023-10-07: 600 mg via ORAL
  Filled 2023-10-05 (×5): qty 1

## 2023-10-05 MED ORDER — ATORVASTATIN 10 MG TABLET
10.0000 mg | ORAL_TABLET | Freq: Every evening | ORAL | Status: DC
Start: 2023-10-05 — End: 2023-10-07
  Administered 2023-10-05 – 2023-10-06 (×2): 10 mg via ORAL
  Filled 2023-10-05: qty 1

## 2023-10-05 MED ORDER — POLYETHYLENE GLYCOL 3350 17 GRAM ORAL POWDER PACKET
17.0000 g | Freq: Every day | ORAL | Status: DC
Start: 2023-10-05 — End: 2023-10-07
  Administered 2023-10-05 – 2023-10-07 (×3): 17 g via ORAL
  Filled 2023-10-05 (×2): qty 1

## 2023-10-05 MED ORDER — ONDANSETRON HCL (PF) 4 MG/2 ML INJECTION SOLUTION
4.0000 mg | INTRAMUSCULAR | Status: AC
Start: 2023-10-05 — End: 2023-10-05
  Administered 2023-10-05: 4 mg via INTRAVENOUS

## 2023-10-05 MED ORDER — FAMOTIDINE 20 MG TABLET
40.0000 mg | ORAL_TABLET | Freq: Two times a day (BID) | ORAL | Status: DC
Start: 2023-10-05 — End: 2023-10-07
  Administered 2023-10-05 – 2023-10-07 (×4): 40 mg via ORAL
  Filled 2023-10-05 (×3): qty 2

## 2023-10-05 MED ORDER — LORAZEPAM 1 MG TABLET
ORAL_TABLET | ORAL | Status: AC
Start: 2023-10-05 — End: 2023-10-05
  Filled 2023-10-05: qty 1

## 2023-10-05 MED ORDER — IPRATROPIUM 0.5 MG-ALBUTEROL 3 MG (2.5 MG BASE)/3 ML NEBULIZATION SOLN
3.0000 mL | INHALATION_SOLUTION | RESPIRATORY_TRACT | Status: DC | PRN
Start: 2023-10-05 — End: 2023-10-07

## 2023-10-05 MED ORDER — METHOCARBAMOL 750 MG TABLET
750.0000 mg | ORAL_TABLET | Freq: Two times a day (BID) | ORAL | Status: DC | PRN
Start: 2023-10-05 — End: 2023-10-07
  Administered 2023-10-06 – 2023-10-07 (×2): 750 mg via ORAL
  Filled 2023-10-05 (×2): qty 1

## 2023-10-05 MED ORDER — FENTANYL (PF) 50 MCG/ML INJECTION SOLUTION
50.0000 ug | INTRAMUSCULAR | Status: DC
Start: 2023-10-05 — End: 2023-10-05

## 2023-10-05 MED ORDER — ASPIRIN 81 MG TABLET,DELAYED RELEASE
81.0000 mg | DELAYED_RELEASE_TABLET | Freq: Two times a day (BID) | ORAL | Status: DC
Start: 2023-10-05 — End: 2023-10-05

## 2023-10-05 MED ORDER — SODIUM CHLORIDE 0.9 % INTRAVENOUS PIGGYBACK
100.0000 mg | Freq: Every evening | INTRAVENOUS | Status: DC
Start: 2023-10-06 — End: 2023-10-06
  Filled 2023-10-05: qty 5

## 2023-10-05 MED ORDER — HYDROMORPHONE 2 MG/ML INJECTION WRAPPER
0.5000 mg | INJECTION | INTRAMUSCULAR | Status: AC
Start: 2023-10-05 — End: 2023-10-05
  Administered 2023-10-05: 0.5 mg via INTRAVENOUS

## 2023-10-05 MED ORDER — KETOROLAC 30 MG/ML (1 ML) INJECTION SOLUTION
15.0000 mg | Freq: Four times a day (QID) | INTRAMUSCULAR | Status: DC | PRN
Start: 2023-10-05 — End: 2023-10-07
  Administered 2023-10-06 – 2023-10-07 (×2): 15 mg via INTRAVENOUS
  Filled 2023-10-05 (×3): qty 1

## 2023-10-05 MED ORDER — ONDANSETRON HCL (PF) 4 MG/2 ML INJECTION SOLUTION
8.0000 mg | Freq: Three times a day (TID) | INTRAMUSCULAR | Status: DC | PRN
Start: 2023-10-05 — End: 2023-10-07

## 2023-10-05 MED ORDER — POTASSIUM CHLORIDE ER 20 MEQ TABLET,EXTENDED RELEASE(PART/CRYST)
ORAL_TABLET | ORAL | Status: AC
Start: 2023-10-05 — End: 2023-10-05
  Filled 2023-10-05: qty 2

## 2023-10-05 MED ORDER — SODIUM CHLORIDE 0.9 % INTRAVENOUS PIGGYBACK
100.0000 mg | INTRAVENOUS | Status: AC
Start: 2023-10-05 — End: 2023-10-05
  Administered 2023-10-05: 0 mg via INTRAVENOUS
  Administered 2023-10-05: 100 mg via INTRAVENOUS
  Filled 2023-10-05: qty 5

## 2023-10-05 MED ORDER — FLUCONAZOLE 200 MG/100 ML IN SOD. CHLORIDE (ISO) INTRAVENOUS PIGGYBACK
200.0000 mg | INJECTION | INTRAVENOUS | Status: DC
Start: 2023-10-05 — End: 2023-10-05
  Filled 2023-10-05: qty 100

## 2023-10-05 MED ORDER — CARVEDILOL 6.25 MG TABLET
3.1250 mg | ORAL_TABLET | Freq: Two times a day (BID) | ORAL | Status: DC
Start: 2023-10-05 — End: 2023-10-07
  Administered 2023-10-07: 0 mg via ORAL

## 2023-10-05 MED ORDER — LORAZEPAM 1 MG TABLET
1.0000 mg | ORAL_TABLET | Freq: Every evening | ORAL | Status: DC
Start: 2023-10-05 — End: 2023-10-07
  Administered 2023-10-05 – 2023-10-06 (×2): 1 mg via ORAL
  Filled 2023-10-05: qty 1

## 2023-10-05 MED ORDER — ESCITALOPRAM 10 MG TABLET
10.0000 mg | ORAL_TABLET | Freq: Every day | ORAL | Status: DC
Start: 2023-10-05 — End: 2023-10-07
  Administered 2023-10-05: 10 mg via ORAL
  Administered 2023-10-06: 0 mg via ORAL
  Administered 2023-10-06: 10 mg via ORAL
  Filled 2023-10-05 (×5): qty 1

## 2023-10-05 MED ORDER — SODIUM CHLORIDE 0.9 % IV BOLUS
1000.0000 mL | INJECTION | Status: AC
Start: 2023-10-05 — End: 2023-10-05
  Administered 2023-10-05: 1000 mL via INTRAVENOUS
  Administered 2023-10-05: 0 mL via INTRAVENOUS

## 2023-10-05 MED ORDER — GABAPENTIN 600 MG TABLET
ORAL_TABLET | ORAL | Status: AC
Start: 2023-10-05 — End: 2023-10-05
  Filled 2023-10-05: qty 1

## 2023-10-05 MED ORDER — ENOXAPARIN 40 MG/0.4 ML SUBCUTANEOUS SYRINGE
40.0000 mg | INJECTION | SUBCUTANEOUS | Status: DC
Start: 2023-10-05 — End: 2023-10-07
  Administered 2023-10-05 – 2023-10-06 (×2): 40 mg via SUBCUTANEOUS
  Filled 2023-10-05: qty 0.4

## 2023-10-05 MED ORDER — ENOXAPARIN 40 MG/0.4 ML SUBCUTANEOUS SYRINGE
INJECTION | SUBCUTANEOUS | Status: AC
Start: 2023-10-05 — End: 2023-10-05
  Filled 2023-10-05: qty 0.4

## 2023-10-05 MED ORDER — FAMOTIDINE 20 MG TABLET
ORAL_TABLET | ORAL | Status: AC
Start: 2023-10-05 — End: 2023-10-05
  Filled 2023-10-05: qty 2

## 2023-10-05 MED ORDER — POLYETHYLENE GLYCOL 3350 17 GRAM ORAL POWDER PACKET
ORAL | Status: AC
Start: 2023-10-05 — End: 2023-10-05
  Filled 2023-10-05: qty 1

## 2023-10-05 MED ORDER — POTASSIUM CHLORIDE ER 20 MEQ TABLET,EXTENDED RELEASE(PART/CRYST)
40.0000 meq | ORAL_TABLET | ORAL | Status: AC
Start: 2023-10-05 — End: 2023-10-06
  Administered 2023-10-05 – 2023-10-06 (×2): 40 meq via ORAL
  Filled 2023-10-05: qty 2

## 2023-10-05 MED ORDER — ATORVASTATIN 10 MG TABLET
ORAL_TABLET | ORAL | Status: AC
Start: 2023-10-05 — End: 2023-10-05
  Filled 2023-10-05: qty 1

## 2023-10-05 NOTE — ED Nurses Note (Signed)
Medication changed by provider. Message sent to pharmacy for new medications orders placed at 1429.

## 2023-10-05 NOTE — ED Triage Notes (Signed)
BILATERAL KNEE REPLACEMENT X2 DAYS  AGO. C/O INCREASED KNEE PAIN, STATES PAIN MEDS ARE NOT WORKING.   BLS TRANSPORT.

## 2023-10-05 NOTE — ED Nurses Note (Signed)
Pt offered waters and bedpan for urine unable to give sample

## 2023-10-05 NOTE — ED Nurses Note (Signed)
Contacted lab to clarify the positive blood culture report received on patient at this time. Informed Adolph Pollack *lab tech* that specimen had just been collected a few hours prior to the critical notification. She said that she did observe the blood culture bottle and observed what appeared to be yeast present. She reports that she did place the bottle back in the analyzer and would have someone else to inspect the bottle if the analyzer showed something different. Notified K. Harmon PA of the above information.

## 2023-10-05 NOTE — H&P (Signed)
[place] MEDICINE [facility]    HOSPITALIST H&P    Ashley Reid 67 y.o. female ED31/ED31   Date of Service: 10/05/2023    Date of Admission:  10/05/2023   PCP: Earlean Shawl, DO Code Status:FULL CODE: ATTEMPT RESUSCITATION/CPR       Chief Complaint:  " knee pain post op "    HPI:  67 year old Caucasian female presented to the ED via EMS accompanied by her husband post bilateral knee surgery on Monday, 2 days ago.  She had been having trouble for about a year and a half.  Had history of a torn meniscus to the right knee that was surgically repaired, but had a lot of wear and tear to the knees.  She did injections for a while that initially helped and then no longer helped.  She ended up going to a cane and then when shopping would have to lean over the cart to support herself and then when she left the house she would try to find an electric scooter to get around.  She was scheduled for outpatient physical therapy and was supposed to have gone today.  Husband states that the day after surgery they went to get her up and she passed out with a blood pressure that dropped to 40 diastolic per patient with dizziness.  The next day but got her up and she was able to walk around the nurse's station and did pretty well.  She was discharged home.  Has been said that she was given Norco 7.5 to take and patient says it is not touching her pain.  He said that he noticed that she was confused after taking the medication last night and this morning worsening today.  She was not able to walk and had difficulty even bearing weight and has not said he was not able to get her up to take her out physical therapy.  They contacted Dr. Delana Meyer office and was referred to the ED further evaluation.  Patient is on a baby aspirin a day for deep vein thrombosis prophylaxis.  Has been feels like patient will need in facility rehab.                      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    CPAP (continuous positive airway pressure) dependence     Depression     Fibromyalgia     GERD (gastroesophageal reflux disease)     Headache     History of heart murmur in childhood     History of palpitations     "Heart beating hard"    Hyperlipidemia     Insomnia     Neck problem     Neuropathy (CMS HCC)     Osteopenia     Prediabetes     Sleep apnea         PSHx:   Past Surgical History:   Procedure Laterality Date  BREAST CYST EXCISION      HX BACK SURGERY      HX CYST INCISION AND DRAINAGE Right 1974    REMOVED CYST    HX SHOULDER SURGERY Right     HX TONSILLECTOMY      KNEE CARTILAGE SURGERY Right     REPLACEMENT TOTAL KNEE Bilateral     SKIN CANCER EXCISION      (L) leg          Allergies:    Allergies   Allergen Reactions    Penicillins Rash    Diclofenac  Other Adverse Reaction (Add comment)     "Chest pain"    Social History  Social History     Tobacco Use    Smoking status: Never    Smokeless tobacco: Never   Vaping Use    Vaping status: Never Used   Substance Use Topics    Alcohol use: Never    Drug use: Never       Family History  Family Medical History:       Problem Relation (Age of Onset)    No Known Problems Mother, Father, Sister, Brother, Maternal Grandmother, Maternal Grandfather, Paternal Grandmother, Paternal Grandfather, Daughter, Son, Maternal Aunt, Maternal Uncle, Paternal Aunt, Paternal Uncle, Other               Home Meds:      Prior to Admission medications    Medication Sig Start Date End Date Taking? Authorizing Provider   aspirin (ECOTRIN) 81 mg Oral Tablet, Delayed Release (E.C.) Take 1 Tablet (81 mg total) by mouth Twice  daily X 6 weeks 10/03/23   Alexia Freestone, MD   biotin 1 mg Oral Capsule Take by mouth    Provider, Historical   carvediloL (COREG) 3.125 mg Oral Tablet Take 1 Tablet (3.125 mg total) by mouth Twice daily    Provider, Historical   cyanocobalamin (VITAMIN B 12) 1,000 mcg Oral Tablet Take 1 Tablet (1,000 mcg total) by mouth Once a day    Provider, Historical   escitalopram oxalate (LEXAPRO) 10 mg Oral Tablet Take 1 Tablet (10 mg total) by mouth Once a day    Provider, Historical   famotidine (PEPCID) 40 mg Oral Tablet Take 1 Tablet (40 mg total) by mouth Twice daily    Provider, Historical   gabapentin (NEURONTIN) 600 mg Oral Tablet Take 1 Tablet (600 mg total) by mouth Three times a day    Provider, Historical   HYDROcodone-acetaminophen (NORCO) 7.5-325 mg Oral Tablet Take 1 Tablet by mouth Every 6 hours as needed for Pain 10/03/23   Alexia Freestone, MD   lansoprazole (PREVACID) 30 mg Oral Capsule, Delayed Release(E.C.) Take 1 Capsule (30 mg total) by mouth Once a day 07/26/23   Provider, Historical   LORazepam (ATIVAN) 1 mg Oral Tablet Take 1 Tablet (1 mg total) by mouth Every night    Provider, Historical   methocarbamoL (ROBAXIN) 750 mg Oral Tablet Take 1 Tablet (750 mg total) by mouth Every 12 hours as needed 08/10/23   Provider, Historical   simvastatin (ZOCOR) 20 mg Oral Tablet Take 1 Tablet (20 mg total) by mouth Every evening    Provider, Historical   acetaminophen (TYLENOL ARTHRITIS PAIN) 650 mg Oral Tablet Sustained Release Take 2 Tablets (1,300 mg total) by mouth Every 8 hours as needed for Pain  10/04/23  Provider, Historical   celecoxib (CELEBREX) 200 mg Oral Capsule Take 1 Capsule (200 mg total) by mouth Once a day  10/04/23  Provider, Historical   lansoprazole (PREVACID SOLUTAB) 15 mg Oral Tablet,Rapid Dissolve, DR Take 2 Tablets (30 mg total) by mouth Once a day  09/30/23  Provider, Historical   traMADoL (ULTRAM) 50 mg Oral Tablet Take by mouth Twice per day as needed for Pain  10/04/23  Provider,  Historical          ROS:   All ten point review of systems was negative except what is in the HPI.          Physical:  Physical Exam  Constitutional:       Appearance: Normal appearance.   HENT:      Head: Normocephalic and atraumatic.      Nose: Nose normal.      Mouth/Throat:      Mouth: Mucous membranes are dry.      Pharynx: Oropharynx is clear.   Eyes:      Extraocular Movements: Extraocular movements intact.      Pupils: Pupils are equal, round, and reactive to light.   Cardiovascular:      Rate and Rhythm: Normal rate and regular rhythm.      Pulses: Normal pulses.      Heart sounds: Normal heart sounds.   Pulmonary:      Effort: Pulmonary effort is normal.      Breath sounds: Normal breath sounds.   Abdominal:      General: Bowel sounds are normal.      Palpations: Abdomen is soft.   Musculoskeletal:         General: Swelling and tenderness present. Normal range of motion.      Cervical back: Normal range of motion and neck supple.      Comments: Dressings noted bilaterally to knees.  Some mild swelling and erythema around the dressing site.  Skin is warm, not hot.   Skin:     General: Skin is warm and dry.      Capillary Refill: Capillary refill takes less than 2 seconds.      Findings: Erythema present.      Comments: Very mild around the patella seen to periphery of dressing   Neurological:      General: No focal deficit present.      Mental Status: She is alert and oriented to person, place, and time.   Psychiatric:         Mood and Affect: Mood normal.         Behavior: Behavior normal.        Diagnostic studies:  No results found.       EKG interpretation:         Assessments:  Active Hospital Problems   (*Primary Problem)    Diagnosis    Knee pain, bilateral    Fungemia    Post-op pain       Plan:  Patient will be admitted for the above problems.    Bilateral knee pain/post op two days  -tele/pulse ox  -consult orthopedist  -tylenol, Toradol for pain  -muscle relaxer, robaxin  -CM for in patient  rehab  -PT    Fungemia  -Diflucan IV  -COPAT    Other  -regular diet  -home meds as appropriate  -daily labs      Code status: full code  DVT prophylaxis:  lovenox  Diet: DIET REGULAR Do you want to initiate MNT Protocol? Yes    Disposition:  The patient is currently requiring treatment on the medical floor for problems as above.  Patient will be closely evaluated and monitored and interventions adjusted based on clinical course.  Estimated length of stay > than 48 hr to obtain full medical treatment.      Judene Companion, FNP-BC    [place] [facility] HOSPITALIST      This note was partially generated using MModal Fluency Direct system, and there may be some incorrect words, spellings, and punctuation that were not noted in checking the note before saving.

## 2023-10-05 NOTE — Consults (Signed)
[facility]/ID eConsult Initial Note    Reason for Consult: Fungemia    Pertinent Clinical History: Ms. 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  She had reportedly been having trouble with her bilateral knees for approximately 1.5 years.  She tried injections that helped for a while and then stopped working.  Postoperatively, she had an episode of near syncope when she was documented to be clammy/dizzy/lightheaded with hypotension while working with physical therapy.  She was subsequently able to ambulate around the nurse's station.  Following discharge home, she was confused and unable to walk/work with physical therapy.  While hospitalized, she has been afebrile with Tmax 37.4 degrees Celsius.  Additional details are included below.    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, prior right knee torn meniscus status-post surgery, right shoulder surgery, back surgery    Antimicrobial Allergies: Penicillins - rash    Labs, Micro, and Imaging Reviewed:    10/05/2023 WBC 9,100, with ANC 6,400, hemoglobin 9.9 g/dL, hematocrit 78.2%, platelets 203,000, BUN 20 mg/dL, creatinine 9.56 mg/dL, total bilirubin 0.4 mg/dL, AST 13 U/L, ALT 13 U/L, alkaline phosphatase 48 U/L, urinalysis WBCs 1 and RBCs 1    (09/05/2023 Urine culture: Streptococcus agalactiae 50,000 CFU/mL)  10/05/2023 Blood cultures X 2 sets: Yeast in 1 of 2 sets (1 of 4  bottles)    10/05/2023 Radiograph chest: No acute findings  10/05/2023 Peripheral venous duplex ultrasound bilateral lower extremities: No evidence of deep vein thrombosis in bilateral lower extremities from level of common femoral to popliteal veins.    Assessment/Recommendations:     67 YO F status-post bilateral total knee arthroplasties on 10/03/2023 currently readmitted with fungemia  1. Please start micafungin 100 mg IV Q24 hours (ordered to start 10/05/2023 PM) and continue this regimen while awaiting additional information about the patient's yeast isolate.  2. Please discontinue order for fluconazole IV (discontinued prior to administration of first dose).  3. Will follow for result of CHROMagar.    4. Please have the patient's yeast isolate sent to the microbiology lab at [facility] for organism identification and susceptibility testing (called the microbiology lab at [facility] 10/05/2023 PM and will follow up with day shift staff on 10/06/2023 to help facilitate).  5. Please repeat blood cultures X 2 sets on 10/07/2023 and every 48 hours until negative.  6. Please ask Dr. Dianah Field Surgery team to evaluate the patient's bilateral total knee arthroplasties and perform aspirations if there is any clinical concern.  Please have photographs unploaded into Media in Epic when possible.  Please also evaluate sites of recent lines (although she does not appear to have had central lines).  7. Please monitor CBC with differential, BUN, creatinine, hepatic function panel, and inflammatory CRP (latter added on to labs from 10/05/2023).  8. Will follow and provide updated recommendations.    I spent 5 minutes or more reviewing the patient's medical record, lab/micro/imaging studies, and/or medications.  Communicated with provider through Secure Chat/In [employer].  Total time >31 min.     Levin Erp, M.D., 10/05/2023  [facility] Medical Director and Assistant  Professor  Division of Infectious Diseases  [facility] [facility]

## 2023-10-05 NOTE — ED Nurses Note (Signed)
Message sent for Lexapro to pharmacy.

## 2023-10-05 NOTE — ED Provider Notes (Signed)
Emergency Medicine      Name: Ashley Reid  Age and Gender: 67 y.o. female  Date of Birth: June 22, 1956  MRN: [id number]  PCP: Earlean Shawl, DO    CC:  Chief Complaint   Patient presents with    Knee Pain       HPI:  Ashley Reid is a 67 y.o. White female who presents to the ER with bilateral knee pain.  Patient states she had a bilateral total knee replacement by Dr. Lindwood Qua and Higinbotham 2 days ago.  She says she was discharged yesterday and was having severe pain in her knees which has since worsened to the point that she can not ambulate at home.  She says she was discharged home on pain medication (Norco 7.5/325) and it is not controlling her pain.  Additionally, husband states she seemed a little bit confused last night but this morning after she had a dose of the pain medication, seemed very confused.  They contacted Dr. Lindwood Qua who referred them to the ER for further evaluation.    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    CPAP (continuous positive airway pressure) dependence     Depression     Fibromyalgia     GERD (gastroesophageal reflux disease)     Headache     History of heart murmur in childhood     History of palpitations     "Heart beating hard"    Hyperlipidemia     Insomnia     Neck problem     Neuropathy (CMS HCC)     Osteopenia     Prediabetes     Sleep apnea            Allergies   Allergen Reactions    Penicillins Rash    Diclofenac  Other Adverse Reaction (Add comment)     "Chest pain"       Past Surgical History:   Procedure Laterality Date    BILATERAL TOTAL KNEE ARTHROPLASTIES USING PRESSFIT TRIATHLON IMPLANTS Bilateral 10/03/2023    Performed by Alexia Freestone, MD at [facility]    BREAST CYST EXCISION      COLONOSCOPY beyond the splenic flexure N/A 08/31/2023    Performed by Fidela Juneau, MD at [facility] T&D    HX BACK SURGERY      HX CYST  INCISION AND DRAINAGE Right 1974    REMOVED CYST    HX SHOULDER SURGERY Right     HX TONSILLECTOMY      KNEE CARTILAGE SURGERY Right     REPLACEMENT TOTAL KNEE Bilateral     SKIN CANCER EXCISION      (L) leg        Social History     Socioeconomic History    Marital status: Married   Tobacco Use    Smoking status: Never    Smokeless tobacco: Never   Vaping Use    Vaping status: Never Used   Substance and Sexual Activity    Alcohol use: Never    Drug use: Never   Other Topics Concern    Ability to Walk 1 Flight of Steps without SOB/CP Yes    Routine Exercise No    Ability to Walk 2 Flight of Steps without SOB/CP  No    Unable to Ambulate No    Total Care No    Ability To Do Own ADL's Yes    Uses Walker No    Other Activity Level Yes    Uses Cane Yes     Social Determinants of Health     Social Connections: Low Risk  (10/03/2023)    Social Connections     SDOH Social Isolation: 5 or more times a week       ROS:  No other overt positive review of systems are noted other than stated in the HPI.      Objective:    ED Triage Vitals [10/05/23 1337]   BP (Non-Invasive) 130/78   Heart Rate 85   Respiratory Rate 16   Temperature 37.2 C (99 F)   SpO2 97 %   Weight 83.9 kg (185 lb)   Height 1.702 m (5\' 7" )     Filed Vitals:    10/05/23 1640 10/05/23 1651 10/05/23 1653 10/05/23 1700   BP: 102/61 91/60  (!) 98/52   Pulse: 88   84   Resp: 18   18   Temp: 37.4 C (99.4 F)      SpO2: 94%  98% 97%       Nursing notes and vital signs reviewed.    Constitutional - No acute distress.  Alert and Active.  HEENT - Normocephalic. Conjunctiva clear.  Moist mucous membranes.   Neck - Trachea midline. No stridor. No hoarseness.  Cardiac - Regular rate and rhythm. No murmurs, rubs, or gallops. Intact distal pulses.  Respiratory/Chest - Normal respiratory effort. Clear to auscultation bilaterally. No rales, wheezes or rhonchi.   Musculoskeletal - Good AROM. Mild edema of the bilateral knees with some mild early ecchymosis of the left knee  noted.   Skin - Warm and dry, without any rashes or other lesions.  Neuro - Alert and oriented x 3. Moving all extremities symmetrically.   Psych - Normal mood and affect. Behavior is normal            Any pertinent labs and imaging obtained during this encounter reviewed below in MDM.    MDM/ED Course:      Medical Decision Making  Patient presents to the ER with bilateral knee pain.  Patient states she had a bilateral total knee replacement by Dr. Lindwood Qua and Higinbotham 2 days ago.  She says she was discharged yesterday and was having severe pain in her knees which has since worsened to the point that she can not ambulate at home.  She says she was discharged home on pain medication (Norco 7.5/325) and it is not controlling her pain.  Additionally, husband states she seemed a little bit confused last night but this morning after she had a dose of the pain medication, seemed very confused.  They contacted Dr. Lindwood Qua who referred them to the ER for further evaluation.  Differential diagnoses include medication adverse effect, acute metabolic encephalopathy, deep vein thrombosis.  Patient underwent diagnostics with results as noted in ED course.  Ortho was consulted who felt the patient should be observe for clarification of the blood cultures and to arrange inpatient PT but they will consult.  Hospitalist then contacted for admission    Amount and/or Complexity of Data Reviewed  Labs: ordered. Decision-making details documented in ED Course.  Radiology: ordered. Decision-making details documented in ED Course.  ECG/medicine tests: independent interpretation performed.    Risk  Prescription drug management.  Parenteral controlled substances.  Decision regarding hospitalization.              ED Course as of 10/05/23 1810   Wed Oct 05, 2023   1502 CBC/DIFF(!)  Hemoglobin 9.9, hematocrit 29.2   1529 XR AP MOBILE CHEST  NO ACUTE FINDINGS.   1529 COMPREHENSIVE METABOLIC PANEL, NON-FASTING(!)  Potassium 3.2, albumin 3.4,  glucose 154, total protein 5.4   1701 PERIPHERAL VENOUS DUPLEX - LOWER  No evidence for DVT in the bilateral lower extremity/extremities from the level of the common femoral to popliteal veins   1809 URINALYSIS, MACROSCOPIC AND MICROSCOPIC W/CULTURE REFLEX  Normal   1809 ADULT ROUTINE BLOOD CULTURE, SET OF 2 ADULT BOTTLES (BACTERIA AND YEAST)(!!)  Yeast present in 1 bottle from 1 set of blood cultures       Orders Placed This Encounter    ADULT ROUTINE BLOOD CULTURE, SET OF 2 ADULT BOTTLES (BACTERIA AND YEAST)    ADULT ROUTINE BLOOD CULTURE, SET OF 2 ADULT BOTTLES (BACTERIA AND YEAST)    XR AP MOBILE CHEST    CBC/DIFF    COMPREHENSIVE METABOLIC PANEL, NON-FASTING    URINALYSIS, MACROSCOPIC AND MICROSCOPIC W/CULTURE REFLEX    CBC WITH DIFF    URINALYSIS, MACROSCOPIC    URINALYSIS, MICROSCOPIC    EXTRA TUBES    BLUE TOP TUBE    GOLD TOP TUBE    GRAY TOP TUBE    PERIPHERAL VENOUS DUPLEX - LOWER    NS bolus infusion 1,000 mL    ondansetron (ZOFRAN) 2 mg/mL injection    HYDROmorphone (DILAUDID) 2 mg/mL injection           Impression:   Clinical Impression   Pain of both lower extremities due to bilateral total knee replacements, initial encounter (CMS HCC) (Primary)   Metabolic encephalopathy       Disposition: Admitted    / Maryagnes Amos PA-C  10/05/2023, 14:34  [facility]  Department of Emergency Medicine  [facility]    Portions of this note may have been dictated using voice recognition software.     -----------------------  Results for orders placed or performed during the hospital encounter of 10/05/23 (from the past 12 hour(s))   COMPREHENSIVE METABOLIC PANEL, NON-FASTING   Result Value Ref Range    SODIUM 140 136 - 145 mmol/L    POTASSIUM 3.2 (L) 3.5 - 5.1 mmol/L    CHLORIDE 107 98 - 107 mmol/L    CO2 TOTAL 29 21 - 31 mmol/L    ANION GAP 4 4 - 13 mmol/L    BUN 20 7 - 25 mg/dL    CREATININE 6.44 0.34 - 1.30 mg/dL    BUN/CREA RATIO 33 (H) 6 - 22    ESTIMATED GFR 98 >59 mL/min/1.51m^2     ALBUMIN 3.4 (L) 3.5 - 5.7 g/dL    CALCIUM 8.6 8.6 - 74.2 mg/dL    GLUCOSE 595 (H) 74 - 109 mg/dL    ALKALINE PHOSPHATASE 48 34 - 104 U/L    ALT (SGPT) 13 7 - 52 U/L    AST (SGOT) 13 13 - 39 U/L    BILIRUBIN TOTAL 0.4 0.3 - 1.0 mg/dL    PROTEIN TOTAL 5.4 (L) 6.4 - 8.9 g/dL    ALBUMIN/GLOBULIN RATIO 1.7 (H) 0.8 - 1.4    OSMOLALITY, CALCULATED 285 270 - 290 mOsm/kg    CALCIUM, CORRECTED 9.1 8.9 - 10.8 mg/dL    GLOBULIN 2.0 2.0 - 3.5   ADULT ROUTINE BLOOD CULTURE, SET OF 2  ADULT BOTTLES (BACTERIA AND YEAST)    Specimen: Blood   Result Value Ref Range    BLOOD CULTURE, ROUTINE Abnormal Stain (AA)     GRAM STAIN Yeast Present (A)    CBC WITH DIFF   Result Value Ref Range    WBC 9.1 3.8 - 11.8 x10^3/uL    RBC 3.31 (L) 3.63 - 4.92 x10^6/uL    HGB 9.9 (L) 10.9 - 14.3 g/dL    HCT 64.3 (L) 32.9 - 41.9 %    MCV 88.3 75.5 - 95.3 fL    MCH 30.0 24.7 - 32.8 pg    MCHC 34.0 32.3 - 35.6 g/dL    RDW 51.8 84.1 - 66.0 %    PLATELETS 203 140 - 440 x10^3/uL    MPV 8.9 7.9 - 10.8 fL    NEUTROPHIL % 71 43 - 77 %    LYMPHOCYTE % 18 16 - 44 %    MONOCYTE % 11 5 - 13 %    EOSINOPHIL % 0 (L) 1 - 7 %    BASOPHIL % 0 0 - 1 %    NEUTROPHIL # 6.40 1.85 - 7.80 x10^3/uL    LYMPHOCYTE # 1.70 1.00 - 3.00 x10^3/uL    MONOCYTE # 1.00 0.30 - 1.00 x10^3/uL    EOSINOPHIL # 0.00 0.00 - 0.50 x10^3/uL    BASOPHIL # 0.00 0.00 - 0.10 x10^3/uL   BLUE TOP TUBE   Result Value Ref Range    RAINBOW/EXTRA TUBE AUTO RESULT Yes    GRAY TOP TUBE   Result Value Ref Range    RAINBOW/EXTRA TUBE AUTO RESULT Yes    URINALYSIS, MACROSCOPIC   Result Value Ref Range    COLOR Light Yellow Colorless, Light Yellow, Yellow    APPEARANCE Clear Clear    SPECIFIC GRAVITY 1.020 1.002 - 1.030    PH 6.0 5.0 - 9.0    LEUKOCYTES Negative Negative, 100  WBCs/uL    NITRITE Negative Negative    PROTEIN Negative Negative, 10 , 20  mg/dL    GLUCOSE Negative Negative, 30  mg/dL    KETONES Negative Negative, Trace mg/dL    BILIRUBIN Negative Negative, 0.5 mg/dL    BLOOD Negative Negative, 0.03  mg/dL    UROBILINOGEN Normal Normal mg/dL   URINALYSIS, MICROSCOPIC   Result Value Ref Range    MUCOUS Rare Rare, Occasional, Few /hpf    RBCS 1 <4 /hpf    WBCS 1 <6 /hpf    SQUAMOUS EPITHELIAL <1 <28 /hpf     XR AP MOBILE CHEST   Final Result   NO ACUTE FINDINGS.            Radiologist location ID: [id number]

## 2023-10-06 ENCOUNTER — Inpatient Hospital Stay (HOSPITAL_COMMUNITY): Payer: Medicare Other

## 2023-10-06 DIAGNOSIS — R7982 Elevated C-reactive protein (CRP): Secondary | ICD-10-CM

## 2023-10-06 LAB — CBC WITH DIFF
BASOPHIL #: 0 10*3/uL (ref 0.00–0.10)
BASOPHIL %: 0 % (ref 0–1)
EOSINOPHIL #: 0 10*3/uL (ref 0.00–0.50)
EOSINOPHIL %: 0 % — ABNORMAL LOW (ref 1–7)
HCT: 28.6 % — ABNORMAL LOW (ref 31.2–41.9)
HGB: 9.8 g/dL — ABNORMAL LOW (ref 10.9–14.3)
LYMPHOCYTE #: 2.1 10*3/uL (ref 1.00–3.00)
LYMPHOCYTE %: 32 % (ref 16–44)
MCH: 30.4 pg (ref 24.7–32.8)
MCHC: 34.4 g/dL (ref 32.3–35.6)
MCV: 88.6 fL (ref 75.5–95.3)
MONOCYTE #: 0.7 10*3/uL (ref 0.30–1.00)
MONOCYTE %: 11 % (ref 5–13)
MPV: 8.5 fL (ref 7.9–10.8)
NEUTROPHIL #: 3.8 10*3/uL (ref 1.85–7.80)
NEUTROPHIL %: 57 % (ref 43–77)
PLATELETS: 187 10*3/uL (ref 140–440)
RBC: 3.23 10*6/uL — ABNORMAL LOW (ref 3.63–4.92)
RDW: 14.1 % (ref 12.3–17.7)
WBC: 6.7 10*3/uL (ref 3.8–11.8)

## 2023-10-06 LAB — COMPREHENSIVE METABOLIC PANEL, NON-FASTING
ALBUMIN/GLOBULIN RATIO: 1.5 — ABNORMAL HIGH (ref 0.8–1.4)
ALBUMIN: 3.3 g/dL — ABNORMAL LOW (ref 3.5–5.7)
ALKALINE PHOSPHATASE: 48 U/L (ref 34–104)
ALT (SGPT): 16 U/L (ref 7–52)
ANION GAP: 3 mmol/L — ABNORMAL LOW (ref 4–13)
AST (SGOT): 17 U/L (ref 13–39)
BILIRUBIN TOTAL: 0.5 mg/dL (ref 0.3–1.0)
BUN/CREA RATIO: 23 — ABNORMAL HIGH (ref 6–22)
BUN: 14 mg/dL (ref 7–25)
CALCIUM, CORRECTED: 9.3 mg/dL (ref 8.9–10.8)
CALCIUM: 8.7 mg/dL (ref 8.6–10.3)
CHLORIDE: 109 mmol/L — ABNORMAL HIGH (ref 98–107)
CO2 TOTAL: 29 mmol/L (ref 21–31)
CREATININE: 0.62 mg/dL (ref 0.60–1.30)
ESTIMATED GFR: 98 mL/min/{1.73_m2} (ref >59)
GLOBULIN: 2.2 (ref 2.0–3.5)
GLUCOSE: 93 mg/dL (ref 74–109)
OSMOLALITY, CALCULATED: 282 mosm/kg (ref 270–290)
POTASSIUM: 4.2 mmol/L (ref 3.5–5.1)
PROTEIN TOTAL: 5.5 g/dL — ABNORMAL LOW (ref 6.4–8.9)
SODIUM: 141 mmol/L (ref 136–145)

## 2023-10-06 LAB — MAGNESIUM: MAGNESIUM: 2 mg/dL (ref 1.9–2.7)

## 2023-10-06 MED ORDER — OXYCODONE-ACETAMINOPHEN 5 MG-325 MG TABLET
1.0000 | ORAL_TABLET | Freq: Four times a day (QID) | ORAL | Status: DC | PRN
Start: 2023-10-06 — End: 2023-10-07
  Administered 2023-10-06 – 2023-10-07 (×3): 1 via ORAL
  Filled 2023-10-06 (×3): qty 1

## 2023-10-06 MED ORDER — MAGNESIUM SULFATE 1 GRAM/100 ML IN DEXTROSE 5 % INTRAVENOUS PIGGYBACK
INJECTION | INTRAVENOUS | Status: AC
Start: 2023-10-06 — End: 2023-10-07
  Filled 2023-10-06: qty 100

## 2023-10-06 NOTE — PT Evaluation (Signed)
[facility]  [address]  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  She is very pain focused and fearful of falling requiring encouragement for participation.  She initially required near maximal assist x2 for transfers, but eventually was able to demonstrate minimal assist x1 for transfers.  She ambulated 50 ft with FWW and minimal assist.  She would benefit from PT to address these deficits to achieve highest functional level possible during acute stay.    Total Distance Ambulated: (P) 50  Independence: (P) minimum assist (75% patient effort)  Assistive Device: (P) walker, front wheeled      Discharge Needs:    Equipment Recommendation: (P) gait belt, bedside commode, shower chair, raised toilet seat, front wheeled walker      The patient presents with mobility limitations due to impaired range of motion, impaired strength, and impaired functional activity tolerance that significantly impair/prevent patient's ability to  participate in mobility-related activities of daily living (MRADLs) including  ambulation and transfers in order to safely complete, toileting, bathing, food preparation, laundering/household tasks, safely entering/exiting the home, in reasonable time. This functional mobility deficit can be sufficiently resolved with the use of a (P) gait belt, bedside commode, shower chair, raised toilet seat, front wheeled walker  in order to decrease the risk of falls, morbidity, and mortality in performance of these MRADLs.  Patient is able to safely use this assistive device.    Discharge Disposition: (P) home with assist, home with outpatient services, skilled nursing facility (Pending patient's performance tomorrow)    JUSTIFICATION OF DISCHARGE RECOMMENDATION   Based on current diagnosis, functional performance prior to admission, and current functional performance, this patient requires continued PT services in (P) home with assist, home with outpatient services, skilled nursing facility (Pending patient's performance tomorrow) in order to achieve significant functional improvements in these deficit areas: (P) aerobic capacity/endurance, muscle performance, motor function, ROM (range of motion).        Plan:   Current Intervention: (P) bed mobility training, gait training, stair training, strengthening, stretching, transfer training, ROM (range of motion)  To provide physical therapy services (P)  (1-3x  a day Monday-Saturday)  for duration of (P) until discharge.    The risks/benefits of therapy have been discussed with the patient/caregiver and he/she is in agreement with the established plan of care.       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    CPAP (continuous positive airway pressure) dependence     Depression     Fibromyalgia     GERD (gastroesophageal reflux disease)     Headache     History of heart  murmur in childhood     History of palpitations     "Heart beating hard"    Hyperlipidemia     Insomnia     Neck problem     Neuropathy (CMS HCC)     Osteopenia     Prediabetes     Sleep apnea             Past Surgical History:   Procedure Laterality Date    BREAST CYST EXCISION      HX BACK SURGERY      HX CYST INCISION AND DRAINAGE Right 1974    REMOVED CYST    HX SHOULDER SURGERY Right     HX TONSILLECTOMY      KNEE CARTILAGE SURGERY Right     REPLACEMENT TOTAL KNEE Bilateral     SKIN CANCER EXCISION      (L) leg                 10/06/23 0935   Rehab Session   Document Type evaluation   PT Visit Date 10/06/23   General Information   Patient Profile Reviewed yes   Pertinent History of Current Functional Problem Ashley Reid presented to the ED 11/27 due to severe bilateral knee pain and episodes of syncope.  She had elective bilateral TKA 11/25 and had a syncope episode after surgery.  She was discharged home and did fine at first, but the next morning had unrelenting bilateral knee pain and couldn't get up.  Was advised to go to the ED for further evaluation.  She was admitted due to possible fungemia, however labs did recently come back negative.   Medical Lines Telemetry   Respiratory Status room air   Existing Precautions/Restrictions fall precautions;full code   Mutuality/Individual Preferences   Anxieties, Fears or Concerns Pain and a significant fear of falling   Individualized Care Needs Assist with functional mobility, encouaragement for movement   Patient-Specific Goals (Include Timeframe) Modulate pain and encourage mobility   Plan of Care Reviewed With patient;spouse   Patient would like to participate in bedside shift report Yes   Living Environment   Lives With spouse   Living Arrangements house   Home Assessment: No Problems Identified   Home Accessibility stairs to enter home   Home Main Entrance   Stair Railings, Main Entrance railings on both sides of stairs   Number of Stairs, Main Entrance five    Functional Level Prior   Ambulation 3 - assistive equipment and person   Transferring 3 - assistive equipment and person   Toileting 3 - assistive equipment and person   Bathing 4 - completely dependent   Dressing 3 - assistive equipment and person   Eating 0 - independent   Communication 0 - understands/communicates without difficulty  Prior Functional Level Comment   (Following surgery on 11/25, spouse states the patient required near dependent assistance at home that he could not physically provide.  Prior to surgery, she was independent with a cane and all functional ADLs.)   Pre Treatment Status   Pre Treatment Patient Status Patient sitting in bedside chair or w/c   Support Present Pre Treatment  Clinical assistant present   Communication Pre Treatment  Nurse;Charge Nurse   Cognitive Assessment/Interventions   Behavior/Mood Observations behavior appropriate to situation, WNL/WFL   Orientation Status oriented to;person;place;situation   Attention WNL/WFL   Follows Commands WNL   Pre- Treatment Vital Signs   Pre-Treatment Heart Rate (beats/min) 92   Pre SpO2 (%) 95   O2 Delivery Pre Treatment room air   Pre-Treatment Pain   Pretreatment Pain Rating 9/10   Pre/Posttreatment Pain Comment Has ice pack on the left knee   RLE Assessment   RLE Assessment X-Exceptions   RLE ROM   (Able to demonstrate approximately 70-80 degrees of knee flexion while sitting)   RLE Strength 3-/5 left knee   LLE Assessment   LLE Assessment X-Exceptions   LLE ROM   (Able to demonstrate approximately 70-80 degrees of knee flexion while sitting)   LLE Strength 3-/5 left knee   Trunk Assessment   Trunk Assessment X-Exceptions   Trunk Strength 3-/5 with sit to stand.  Requires cueing for core and trunk activation to stand up to neutral   Mobility Assessment/Training   Additional Documentation Transfer Assessment/Treatment (Group);Gait Assessment/Treatment (Group)   Transfer Assessment/Treatment   Transfer Comment Patient is very pain and  fear focused.  Initially required near maximal assist x2 but eventually is able to demonstrate minimal to moderate assist with sit to stand transfers with encouragement   Transfer Impairments pain;strength decreased   Sit-Stand-Sit, Assist Device walker, front wheeled   Transfer Safety Issues sequencing ability decreased;step length decreased;weight-shifting ability decreased  (Fear of falling)   Sit-Stand Independence moderate assist (50% patient effort);verbal cues required;2 person assist required   Stand-Sit Independence moderate assist (50% patient effort)   Gait Assessment/Treatment   Total Distance Ambulated 50   Gait Speed Decreased   Impairments  pain;strength decreased;endurance   Deviations  step length decreased;stride length decreased;weight-shifting ability decreased   Assistive Device  walker, front wheeled   Distance in Feet 50   Independence  minimum assist (75% patient effort)   Maintain Weight Bearing Status able to maintain   Safety Issues  step length decreased;sequencing ability decreased   Post Treatment Status   Post Treatment Patient Status Patient sitting in bedside chair or w/c;Patient safety alarm activated;Telephone within reach;Call light within reach   Support Present Post Treatment  Family present;Clinical assistant present   Communication Post Treatement Nurse;MD   Communication Post Treatment Comment Updated on patient's functional status, pain focused and fear behavior   Patient Effort adequate  (Needs encouragement)   Post-Treatment Vital Signs   Post-treatment Heart Rate (beats/min) 95   Post SpO2 (%) 95   O2 Delivery Post Treatment room air   Post-Treatment Pain   Posttreatment Pain Rating 9/10   Physical Therapy Clinical Impression   Assessment Mrs. Prange presented to the ED 11/27 due to unrelenting bilateral knee pain from elective bilateral TKA surgery performed 11/25.  She is very pain focused and fearful of falling requiring encouragement for participation.  She initially  required near maximal assist x2 for transfers, but eventually was able to demonstrate minimal assist x1 for transfers.  She  ambulated 50 ft with FWW and minimal assist.  She would benefit from PT to address these deficits to achieve highest functional level possible during acute stay.   Criteria for Skilled Therapeutic yes   Impairments Found (describe specific impairments) aerobic capacity/endurance;muscle performance;motor function;ROM (range of motion)   Functional Limitations in Following  self-care;home management;community/leisure   Rehab Potential fair   Therapy Frequency   (1-3x a day Monday-Saturday)   Predicted Duration of Therapy Intervention (days/wks) until discharge   Anticipated Equipment Needs at Discharge (PT) gait belt;bedside commode;shower chair;raised toilet seat;front wheeled walker   Anticipated Discharge Disposition home with assist;home with outpatient services;skilled nursing facility  (Pending patient's performance tomorrow)   Evaluation Complexity Justification   Patient History: Co-morbidity/factors that impact Plan of Care 3 or more that impact Plan of Care   Examination Components 4 or more Exam elements addressed   Presentation Stable: Uncomplicated, straight-forward, problem focused   Clinical Decision Making Low complexity   Evaluation Complexity Low complexity   Care Plan Goals   PT Rehab Goals Bed Mobility Goal;Transfer Training Goal;Gait Training Goal;Stairs Training Goal   Planned Therapy Interventions, PT Eval   Planned Therapy Interventions (PT) bed mobility training;gait training;stair training;strengthening;stretching;transfer training;ROM (range of motion)   Stairs Training Goal   Stairs Training Goal, Date Established 10/06/23   Stairs Training Goal, Time to Achieve by discharge   Stairs Training Goal, Independence Level contact guard assist   Stairs Training Goal, Current Status   (unable to assess)   Stairs Training Goal, Assist Device handrail, right;handrail, left    Stairs Training Goal, Number of Stairs to Achieve 5   Transfer Training Goal   Transfer Training Goal, Date Established 10/06/23   Transfer Training Goal, Time to Achieve by discharge   Transfer Training Goal, Current Status moderate assist (50% patient effort)   Transfer Training Goal, Independence Level supervision required   Transfer Training Goal, Assist Device walker, rolling   Gait Training  Goal, Distance to Achieve   Gait Training  Goal, Date Established 10/06/23   Gait Training  Goal, Time to Achieve by discharge   Gait Training  Goal, Independence Level supervision required   Gait Training  Goal, Current Status minimum assist (75% patient effort)   Gait Training  Goal, Assist Device walker, rolling   Gait Training  Goal, Distance to Achieve 340   Bed Mobility Goal   Bed Mobility Goal, Date Established 10/06/23   Bed Mobility Goal, Time to Achieve by discharge   Bed Mobility Goal, Activity Type supine to sit/sit to supine   Bed Mobility Goal, Current Status   (unable to assess)   Bed Mobility Goal, Independence Level supervision required   Bed Mobility Goal, Assistive Device bed rails   Physical Therapy Time and Intention   Total PT Minutes: 40   (INSERT FLOWSHEET)            INTERVENTION MINUTES: EVALUATION 40 minutes    EVALUATION COMPLEXITY : CLINICAL DECISION MAKING OF LOW COMPLEXITY AS INDICATED BY PMH, PHYSICAL THERAPY ASSESSMENT OF MUSCULOSKELETAL AND NEUROLOGICAL SYSTEMS AND ACTIVITY LIMITATIONS. CLINICAL PRESENTATION IS STABLE AND UNCOMPLICATED    Therapist:     Marcell Anger, PT  10/06/2023, 10:59

## 2023-10-06 NOTE — Progress Notes (Signed)
ORTHOPAEDIC SURGERY PROGRESS NOTE    Patient Name: Ashley Reid   MRN: [id number]   Date of Birth: 18-May-1956  Date: 10/06/2023  Age: 67 y.o.    Gender: female  Attending Physician: Ebbie Ridge, DO    Subjective:    HPI:  Ashley Reid is a 67 y.o. female who had bilateral TKA on 10/03/2023.  Day of surgery she had some hypotension.  Next day was able to get up and walk around the nursing station.  She was discharged.  The next morning patient refused to get out of bed.  Husband noted that she had a she said a few things that made him feel that she was a little confused.  She was brought to the ER yesterday by ambulance.  Venous study negative deep vein thrombosis chest x-ray negative.  Initial blood culture did grow yeast.  However repeat blood cultures were negative.  Continues to complain of pain states pain is an issue she has pain with standing walking    Objective:    Last Filed Vitals:  Filed Vitals:    10/05/23 2227 10/05/23 2317 10/06/23 0416 10/06/23 0715   BP:  110/62 127/66 131/75   Pulse: 85 86 74 82   Resp:  18 16 16    Temp:  37.4 C (99.3 F) 36.4 C (97.5 F) 36.2 C (97.2 F)   SpO2:  95% 98% 96%        Musculoskeletal Exam:     Examination of the operative extremity demontrates:    The patient is alert and oriented x3 today.    Right and left knee range of motion is about 7-80 incision is dry without redness drainage signs of infection no pain to palpation calf Homans sign negative palpable pedal pulse    Radiographs:  Results for orders placed or performed during the hospital encounter of 10/05/23 (from the past 72 hour(s))   XR AP MOBILE CHEST     Status: None    Narrative    Braden A Printup    RADIOLOGIST: Mickey Farber, MD    XR AP MOBILE CHEST performed on 10/05/2023 3:14 PM    CLINICAL HISTORY: delirium.  delirium    TECHNIQUE: Frontal view of the chest.    COMPARISON:  09/24/2018    FINDINGS:    Heart size is mildly enlarged.     There is some elevation of the right diaphragm. There are no  acute infiltrates.   Degenerative changes are identified within the thoracic spine.          Impression    NO ACUTE FINDINGS.        Radiologist location ID: [id number]          Lab Data   Lab Results   Component Value Date    WBC 6.7 10/06/2023    HGB 9.8 (L) 10/06/2023    HCT 28.6 (L) 10/06/2023     No results found for: "APTT", "INR", "DDIMER"    Microbiology:      URINE CULTURE   Date Value Ref Range Status   09/05/2023 50,000 CFU/mL Streptococcus agalactiae (Group B) (A)  Final     Comment:     Group B streptococci remain universally susceptible to penicillin, ampicillin and cefazolin.  Resistance to clindamycin and erythromycin can occur.               Impression and Plan:    Impression:   Knee pain following bilateral TKA.      Plan:  Initially patient showed some interested in rehab facility.  However she found out that her insurance carrier did not pay for [facility].  We did discuss discharge to rehab facility when medically stable.  I will go ahead and put in for x-rays of the knees.  Patient's knees appear to be benign today.  Strongly recommended participating with physical therapy this morning.      Benjamine Mola, PA-C  10/06/23 09:40  [facility]  Please call with any questions/concerns     This note has been created with voice recognition software.  Please excuse any errors in transcription.  Occasional wrong word or sound alike substitutions may have occurred due to the inherent limitations of voice recognition software.  Please read the chart carefully and recognize using context with the substitutions may have occurred.

## 2023-10-06 NOTE — Progress Notes (Signed)
[facility]    HOSPITALIST PROGRESS NOTE    Ashley Reid  Date of service: 10/06/2023  Date of Admission:  10/05/2023  Hospital Day:  LOS: 1 day     Subjective: Pt seen and examined. Husband present and physical therapy in room getting ready to ambulate pt. She said that CM told her [facility] does not accept [facility] and offered SNF for rehab. Her husband says he thinks she needs to go because he doesn't think he can get her to OP rehab. Orthopedist team saw her and put in for bilateral knee Xrs. Encouraged her to work with PT. PT will make a recommendation. Micro called Dr. Cliffton Asters to report yeast in blood culture was incorrect and blood cultures are clear at this time. COPAT updated and micafungin Dc'd. CRP elevation could be from recent surgery. Pt was in bedside chair.      Vital Signs:  Filed Vitals:    10/05/23 2227 10/05/23 2317 10/06/23 0416 10/06/23 0715   BP:  110/62 127/66 131/75   Pulse: 85 86 74 82   Resp:  18 16 16    Temp:  37.4 C (99.3 F) 36.4 C (97.5 F) 36.2 C (97.2 F)   SpO2:  95% 98% 96%        Physical Exam:  General:  Patient in NAD, husband present, in bedside chair  Head:  Normocephalic, atraumatic  Eyes:  PERRL, anicteric sclera  ENT:  Oral mucosa moist, no nasal discharge   Neck:  Soft, supple, trachea midline  Heart:  RRR, S1 and S2 normal  Lungs:  Unlabored respirations.  Lungs are clear to auscultation bilaterally, with no wheezes, no rales, no conversational dyspnea  Abdomen:  Soft, active bowel sounds, non-tender to palpation, non-distended  Extremities:  Pulses equal bilaterally.  Capillary refill less than 3 seconds.  Mild edema around patellas.  Skin:  Warm and dry, not diaphoretic.  No ecchymosis noted. No redness noted. Bilateral knee dressings  Neuro:  A&O x 3.  No focal deficits.  Speech intact  Psych:  Cooperative, not agitated    Intake & Output:    Intake/Output Summary (Last 24 hours) at 10/06/2023 1047  Last data filed at 10/06/2023  0836  Gross per 24 hour   Intake 1340 ml   Output --   Net 1340 ml     I/O current shift:  11/28 0700 - 11/28 1859  In: 240 [P.O.:240]  Out: -   Emesis:    BM:       Heme:      acetaminophen (TYLENOL) tablet, 650 mg, Oral, Q6H PRN  atorvastatin (LIPITOR) tablet, 10 mg, Oral, QPM  [Held by provider] carvedilol (COREG) tablet, 3.125 mg, Oral, 2x/day  enoxaparin PF (LOVENOX) 40 mg/0.4 mL SubQ injection, 40 mg, Subcutaneous, Q24H  escitalopram (LEXAPRO) tablet, 10 mg, Oral, Daily  famotidine (PEPCID) tablet, 40 mg, Oral, 2x/day  gabapentin (NEURONTIN) tablet, 600 mg, Oral, 3x/day  ipratropium-albuterol 0.5 mg-3 mg(2.5 mg base)/3 mL Solution for Nebulization, 3 mL, Nebulization, Q4H PRN  ketorolac (TORADOL) 30 mg/mL injection, 15 mg, Intravenous, Q6H PRN  LORazepam (ATIVAN) tablet, 1 mg, Oral, NIGHTLY  magnesium sulfate in D5W 1 gram/100 mL premix IVPB ---Cabinet Override, , ,   methocarbamol (ROBAXIN) tablet, 750 mg, Oral, Q12H PRN  ondansetron (ZOFRAN) 2 mg/mL injection, 8 mg, Intravenous, Q8H PRN  oxyCODONE-acetaminophen (PERCOCET) 5-325mg  per tablet, 1 Tablet, Oral, Q6H PRN  polyethylene glycol (MIRALAX) oral packet, 17 g, Oral, Daily  Labs:  Recent Results (from the past 48 hour(s))   CBC WITH DIFF    Collection Time: 10/06/23  3:50 AM   Result Value    WBC 6.7    HGB 9.8 (L)    HCT 28.6 (L)    PLATELETS 187      No results found for this or any previous visit (from the past 48 hour(s)).   Recent Results (from the past 48 hour(s))   COMPREHENSIVE METABOLIC PANEL, NON-FASTING    Collection Time: 10/06/23  3:50 AM   Result Value    ALKALINE PHOSPHATASE 48    ALT (SGPT) 16    AST (SGOT) 17   COMPREHENSIVE METABOLIC PANEL, NON-FASTING    Collection Time: 10/05/23  2:40 PM   Result Value    ALKALINE PHOSPHATASE 48    ALT (SGPT) 13    AST (SGOT) 13      No results found for this or any previous visit (from the past 48 hour(s)).   No results found for this or any previous visit (from the past 48 hour(s)).   Results  for orders placed or performed in visit on 09/05/23 (from the past 1344 hour(s))   HGA1C (HEMOGLOBIN A1C WITH EST AVG GLUCOSE)    Collection Time: 09/05/23  2:02 PM   Result Value    HEMOGLOBIN A1C 6.0      No results found for this or any previous visit (from the past 48 hour(s)).     Microbiology:  Hospital Encounter on 10/05/23 (from the past 96 hour(s))   ADULT ROUTINE BLOOD CULTURE, SET OF 2 ADULT BOTTLES (BACTERIA AND YEAST)    Collection Time: 10/05/23  2:40 PM    Specimen: Blood   Culture Result Status    GRAM STAIN Yeast Present (A) Preliminary       Imaging:   PERIPHERAL VENOUS DUPLEX - LOWER  Narrative: Ashley Reid    RADIOLOGIST: Markus Jarvis, MD    EXAMINATION: PERIPHERAL VENOUS DUPLEX - LOWER    EXAM DATE/TIME:  10/05/2023 4:56 PM    CLINICAL INDICATION: M25.569: Pain of knee and lower leg  M79.669: Pain of knee and lower leg    COMPARISON: None.    FINDINGS    The bilateral common femoral, femoral (aka the superficial femoral vein) and popliteal veins are patent and fully compressible. No thrombus is visualized.  Impression: No evidence for DVT in the bilateral lower extremity/extremities from the level of the common femoral to popliteal veins.    Radiologist location ID: [id number]  XR AP MOBILE CHEST  Narrative: Ashley Reid    RADIOLOGIST: Mickey Farber, MD    XR AP MOBILE CHEST performed on 10/05/2023 3:14 PM    CLINICAL HISTORY: delirium.  delirium    TECHNIQUE: Frontal view of the chest.    COMPARISON:  09/24/2018    FINDINGS:    Heart size is mildly enlarged.     There is some elevation of the right diaphragm. There are no acute infiltrates.   Degenerative changes are identified within the thoracic spine.    Impression: NO ACUTE FINDINGS.    Radiologist location ID: [id number]      Assessment/ Plan:   Active Hospital Problems   (*Primary Problem)    Diagnosis    *Fungemia    Knee pain, bilateral    Pain of both lower extremities due to bilateral total knee replacements, initial  encounter (CMS [facility])     Plan:  Patient will be admitted for  the above problems.     Bilateral knee pain/post op two days  -tele/pulse ox  -consult orthopedist  -tylenol, Toradol for pain  -muscle relaxer, robaxin  -CM for in patient rehab  -PT     Fungemia  -Diflucan IV-changed to micafungin, DC'd  -COPAT     Other  -regular diet  -home meds as appropriate  -daily labs        Code status: full code  DVT prophylaxis:  lovenox  Diet: DIET REGULAR Do you want to initiate MNT Protocol? Yes    10/06/23-Blood cultures not growing yeast, so micafungin Dc'd. PT working with pt, will make recommendations. Husband wants her to go to SNF rehab, pt is considering. She was set up for OP rehab. BC from 10/05/23 will continue to be followed. COPAT following.    Nutrition:   DIET REGULAR Do you want to initiate MNT Protocol? Yes             Additional clinical characteristics related to nutrition:    - monitor for weight changes   - monitor intake and output    - monitor bowel functions        Lab Results   Component Value Date    ALBUMIN 3.3 (L) 10/06/2023         Disposition Planning:   pending  The Hospitalist personally evaluated and examined the patient in conjunction with the MLP and agree with the assessments, treatment plan and disposition of the patient as recorded by the [facility].   Judene Companion, FNP-BC    [facility] MEDICINE HOSPITALIST

## 2023-10-06 NOTE — Nurses Notes (Signed)
Patients O2 sat dropped to 71% on roomair while sleeping. Patient was snoring when nursing entered the room. 2L NC applied, O2 sat is now 93-96%. Continuous pulse oximetry remains in use.

## 2023-10-06 NOTE — Consults (Signed)
COpAT/ID eConsult Follow-Up Note    Reason for Consult: Initial concern for fungemia    Pertinent Clinical History: Ms. 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  She was initially evaluated through our [place] COpAT/ID eConsult Program on 10/05/2023 PM when her blood cultures/Gram stain reported yeast in 1 of 2 sets (1 of 4 bottles).  On further review by the microbiology lab at [facility] on 10/06/2023 AM, no yeast were present.  She has remained afebrile.  Additional details are documented below.    Antimicrobials: Micafungin 100 mg IV Q24 hours from 10/05/2023; also recently received cefazolin 2 g IV for 1 dose preoperatively on 10/03/2023    Labs, Micro, and Imaging Reviewed:    11/27/204 CRP 8 mg/dL, urinalysis WBCs 1 and RBCs 1  10/06/2023 WBC 6,700, with ANC 3,800 (WBC maximum 9,900 on 10/04/2023), hemoglobin 9.8 g/dL, hematocrit 16.1%, platelets 187,000, BUN 14 mg/dL, creatinine 0.96 mg/dL, total bilirubin 0.5 mg/dL, AST 17 U/L, ALT 16 U/L, alkaline phosphatase 48 U/L    (09/05/2023 Urine culture: Streptococcus agalactiae 50,000 CFU/mL)  10/05/2023 Blood cultures X 2 sets: Initially reported yeast in 1 of 2 sets (1 anaerobic of 4 total bottles) - slide reviewed 10/06/2023 AM and no yeast or other organisms were seen (verified with the microbiology lab at [facility] by phone)    10/05/2023 Radiograph chest: No acute findings  10/05/2023 Peripheral venous duplex ultrasound bilateral  lower extremities: No evidence of deep vein thrombosis in bilateral lower extremities from level of common femoral to popliteal veins.    Assessment/Recommendations:     67 YO F status-post bilateral total knee arthroplasties on 10/03/2023 currently readmitted without evidence of infectious process (further review of blood cultures/Gram stain by the microbiology lab at [facility] on 09/26/2023 AM revealed no yeast or other organisms)  1. Please discontinue micafungin.    2. Called and spoke with the microbiology lab at [facility] on 10/06/2023 AM and received verification of updated blood culture results.  Will continue to follow blood culture results from 10/05/2023 to ensure they remain negative.  3. Elevated inflammatory CRP may be explained by recent surgery/postoperative state.    I spent 5 minutes or more reviewing the patient's medical record, lab/micro/imaging studies, and/or medications.  Communicated with provider through Secure Chat/In [employer].  Total time >31 min.     Levin Erp, M.D., 10/06/2023  COpAT Medical Director and Assistant Professor  Division of Infectious Diseases  [place] [facility]

## 2023-10-06 NOTE — Care Plan (Signed)
Problem: Adult Inpatient Plan of Care  Goal: Absence of Hospital-Acquired Illness or Injury  Intervention: Identify and Manage Fall Risk  Recent Flowsheet Documentation  Taken 10/06/2023 0000 by Amalia Greenhouse, RN  Safety Promotion/Fall Prevention:   fall prevention program maintained   nonskid shoes/slippers when out of bed   safety round/check completed  Taken 10/05/2023 2200 by Amalia Greenhouse, RN  Safety Promotion/Fall Prevention:   fall prevention program maintained   nonskid shoes/slippers when out of bed   safety round/check completed  Intervention: Prevent Skin Injury  Recent Flowsheet Documentation  Taken 10/06/2023 0000 by Amalia Greenhouse, RN  Body Position: other (see comments)  Taken 10/05/2023 2200 by Amalia Greenhouse, RN  Body Position: other (see comments)     Problem: Fall Injury Risk  Goal: Absence of Fall and Fall-Related Injury  Intervention: Promote Injury-Free Environment  Recent Flowsheet Documentation  Taken 10/06/2023 0000 by Amalia Greenhouse, RN  Safety Promotion/Fall Prevention:   fall prevention program maintained   nonskid shoes/slippers when out of bed   safety round/check completed  Taken 10/05/2023 2200 by Amalia Greenhouse, RN  Safety Promotion/Fall Prevention:   fall prevention program maintained   nonskid shoes/slippers when out of bed   safety round/check completed     Problem: Knee Arthroplasty  Goal: Anesthesia/Sedation Recovery  Intervention: Optimize Anesthesia Recovery  Recent Flowsheet Documentation  Taken 10/06/2023 0000 by Amalia Greenhouse, RN  Safety Promotion/Fall Prevention:   fall prevention program maintained   nonskid shoes/slippers when out of bed   safety round/check completed  Taken 10/05/2023 2200 by Amalia Greenhouse, RN  Safety Promotion/Fall Prevention:   fall prevention program maintained   nonskid shoes/slippers when out of bed   safety round/check completed   Care plan ongoing. Fall precautions in place. Medications as scheduled. Telemetry monitoring and pulse oximetry in use. PT. Assisted with ADLs.

## 2023-10-07 ENCOUNTER — Ambulatory Visit (HOSPITAL_COMMUNITY): Payer: Self-pay

## 2023-10-07 DIAGNOSIS — T8484XA Pain due to internal orthopedic prosthetic devices, implants and grafts, initial encounter: Principal | ICD-10-CM

## 2023-10-07 DIAGNOSIS — Z880 Allergy status to penicillin: Secondary | ICD-10-CM

## 2023-10-07 DIAGNOSIS — Z96653 Presence of artificial knee joint, bilateral: Secondary | ICD-10-CM

## 2023-10-07 LAB — MAGNESIUM: MAGNESIUM: 2 mg/dL (ref 1.9–2.7)

## 2023-10-07 LAB — CBC WITH DIFF
BASOPHIL #: 0 10*3/uL (ref 0.00–0.10)
BASOPHIL %: 1 % (ref 0–1)
EOSINOPHIL #: 0.1 10*3/uL (ref 0.00–0.50)
EOSINOPHIL %: 2 % (ref 1–7)
HCT: 27.7 % — ABNORMAL LOW (ref 31.2–41.9)
HGB: 9.6 g/dL — ABNORMAL LOW (ref 10.9–14.3)
LYMPHOCYTE #: 1.9 10*3/uL (ref 1.00–3.00)
LYMPHOCYTE %: 33 % (ref 16–44)
MCH: 30.5 pg (ref 24.7–32.8)
MCHC: 34.7 g/dL (ref 32.3–35.6)
MCV: 87.8 fL (ref 75.5–95.3)
MONOCYTE #: 0.6 10*3/uL (ref 0.30–1.00)
MONOCYTE %: 11 % (ref 5–13)
MPV: 8 fL (ref 7.9–10.8)
NEUTROPHIL #: 3 10*3/uL (ref 1.85–7.80)
NEUTROPHIL %: 53 % (ref 43–77)
PLATELETS: 203 10*3/uL (ref 140–440)
RBC: 3.16 10*6/uL — ABNORMAL LOW (ref 3.63–4.92)
RDW: 14.2 % (ref 12.3–17.7)
WBC: 5.7 10*3/uL (ref 3.8–11.8)

## 2023-10-07 LAB — COMPREHENSIVE METABOLIC PANEL, NON-FASTING
ALBUMIN/GLOBULIN RATIO: 1.5 — ABNORMAL HIGH (ref 0.8–1.4)
ALBUMIN: 3.2 g/dL — ABNORMAL LOW (ref 3.5–5.7)
ALKALINE PHOSPHATASE: 50 U/L (ref 34–104)
ALT (SGPT): 17 U/L (ref 7–52)
ANION GAP: 6 mmol/L (ref 4–13)
AST (SGOT): 16 U/L (ref 13–39)
BILIRUBIN TOTAL: 0.7 mg/dL (ref 0.3–1.0)
BUN/CREA RATIO: 29 — ABNORMAL HIGH (ref 6–22)
BUN: 17 mg/dL (ref 7–25)
CALCIUM, CORRECTED: 9.2 mg/dL (ref 8.9–10.8)
CALCIUM: 8.6 mg/dL (ref 8.6–10.3)
CHLORIDE: 106 mmol/L (ref 98–107)
CO2 TOTAL: 28 mmol/L (ref 21–31)
CREATININE: 0.59 mg/dL — ABNORMAL LOW (ref 0.60–1.30)
ESTIMATED GFR: 99 mL/min/{1.73_m2} (ref >59)
GLOBULIN: 2.1 (ref 2.0–3.5)
GLUCOSE: 103 mg/dL (ref 74–109)
OSMOLALITY, CALCULATED: 281 mosm/kg (ref 270–290)
POTASSIUM: 4.1 mmol/L (ref 3.5–5.1)
PROTEIN TOTAL: 5.3 g/dL — ABNORMAL LOW (ref 6.4–8.9)
SODIUM: 140 mmol/L (ref 136–145)

## 2023-10-07 MED ORDER — OXYCODONE-ACETAMINOPHEN 5 MG-325 MG TABLET
1.0000 | ORAL_TABLET | Freq: Three times a day (TID) | ORAL | 0 refills | Status: AC | PRN
Start: 2023-10-07 — End: ?

## 2023-10-07 NOTE — PT Treatment (Signed)
[facility]  [address]  [place], [address]  [phone]  (Fax) [phone]  Rehabilitation Department  Physical Therapy Daily Inpatient Note    Date: 10/07/2023  Patient's Name: Ashley Reid  Date of Birth: [birth year]  Height: Height: 170.2 cm (5\' 7" )  Weight: Weight: 89.8 kg (198 lb)      Plan: Will continue under current POC.      Discharge Disposition: home with assist, home with outpatient services        Subjective/Objective/Assessment:  Flowsheet    10/07/23 0810   Rehab Session   Document Type therapy progress note (daily note)   PT Visit Date 10/07/23   General Information   Patient Profile Reviewed yes   Medical Lines Telemetry   Respiratory Status room air   Existing Precautions/Restrictions fall precautions   Pre Treatment Status   Pre Treatment Patient Status Patient supine in bed   Support Present Pre Treatment  None   Communication Pre Treatment  Charge Nurse   Communication Pre Treatment Comment cleared for PT   Pre-Treatment Pain   Pretreatment Pain Rating 9/10   Pre/Posttreatment Pain Comment both knees   Transfer Assessment/Treatment   Sit-Stand Independence minimum assist (75% patient effort)   Stand-Sit Independence minimum assist (75% patient effort)   Sit-Stand-Sit, Assist Device walker, front wheeled   Transfer Impairments pain;endurance;strength decreased   Gait Assessment/Treatment   Total Distance Ambulated 50   Independence  contact guard assist   Assistive Device  walker, front wheeled   Impairments  endurance;postural control impaired;strength decreased   Comment small steps,   Balance   Sitting Balance: Static good balance   Sit-to-Stand Balance fair + balance   Standing Balance: Static fair + balance   Post Treatment Status   Post Treatment Patient Status Patient sitting in bedside chair or w/c   Support Present Post Treatment  Nurse present   Communication Post Treatement Charge Nurse   Patient Effort good   Post-Treatment Pain    Posttreatment Pain Rating 9/10   Cognitive Assessment/Intervention   Behavior/Mood Observations behavior appropriate to situation, WNL/WFL   Physical Therapy Time and Intention   Total PT Minutes: 13   Therapy Plan Review/Discharge Plan (PT)   Anticipated Discharge Disposition home with assist;home with outpatient services     Paitent stood with cga , gaited with rw 58ft, returned to the chair with needsin reach, just had pain med prior to session. Couple standing rests needed as well            Intervention minutes: GAIT TRAINING [address] MINUTES    THERAPIST  Lane Hacker, PTA  10/07/2023, 12:42

## 2023-10-07 NOTE — Nurses Notes (Signed)
Discharge instructions given to patient. Patient verbalized understanding. IV access removed. Catheter intact. No active bleeding noted. Pressure dressing applied. All personal belongings packed. Telemetry unit removed and returned to tele. Husband at bedside to take patient home. Patient to be wheeled to awaiting personal vehicle.

## 2023-10-07 NOTE — Discharge Summary (Signed)
[facility]  DISCHARGE SUMMARY    PATIENT NAME:  Ashley Reid, Ashley Reid  MRN:  [id number]  DOB:  01-14-1956    ENCOUNTER DATE:  10/05/2023  INPATIENT ADMISSION DATE: 10/05/2023  DISCHARGE DATE:  10/07/2023    ATTENDING PHYSICIAN: Dr. Ashley Jacobs  SERVICE: PRN HOSPITALIST 5  PRIMARY CARE PHYSICIAN: Earlean Shawl, DO       No lay caregiver identified.    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        Details   aspirin 81 mg Tablet, Delayed Release (E.C.)  Commonly known as: ECOTRIN   81 mg, Oral, 2 TIMES DAILY, X 6 weeks  Refills: 0     biotin 1 mg Capsule   Oral  Refills: 0     carvediloL 3.125 mg Tablet  Commonly known as: COREG   3.125 mg, Oral, 2 TIMES DAILY  Refills: 0     cyanocobalamin 1,000 mcg Tablet  Commonly known as: VITAMIN B 12   1,000 mcg, Oral, DAILY  Refills: 0     escitalopram oxalate 10 mg Tablet  Commonly known as: LEXAPRO   10 mg, Oral, DAILY  Refills: 0     famotidine 40 mg Tablet  Commonly known as: PEPCID   40 mg, Oral, 2 TIMES DAILY  Refills: 0     HYDROcodone-acetaminophen 7.5-325 mg Tablet  Commonly known as: NORCO   1 Tablet, Oral, EVERY 6 HOURS PRN  Qty: 35 Tablet  Refills: 0     lansoprazole 30 mg Capsule, Delayed Release(E.C.)  Commonly known as: PREVACID   30 mg, Oral, DAILY  Refills: 0     LORazepam 1 mg Tablet  Commonly known as: ATIVAN   1 mg, Oral, NIGHTLY  Refills: 0     methocarbamoL 750 mg Tablet  Commonly known  as: ROBAXIN   750 mg, Oral, EVERY 12 HOURS PRN  Refills: 0     Neurontin 600 mg Tablet  Generic drug: gabapentin   600 mg, Oral, 3 TIMES DAILY  Refills: 0     simvastatin 20 mg Tablet  Commonly known as: ZOCOR   20 mg, Oral, EVERY EVENING  Refills: 0            Discharge med list refreshed?  YES     Allergies   Allergen Reactions    Penicillins Rash    Diclofenac  Other Adverse Reaction (Add comment)     "Chest pain"     HOSPITAL PROCEDURE(S):   No orders of the defined types were placed in this encounter.      REASON FOR HOSPITALIZATION AND HOSPITAL COURSE   BRIEF HPI:  This is a 67 y.o., female admitted for bilateral knee pain post surgery  BRIEF HOSPITAL NARRATIVE: 67 yr old female admitted to the hospital on 11/27 from the ED where  she presented for pain to both knees post bilateral knee surgery two days prior. She was set up for OP physical therapy and husband could not manage to get her up due to the pain and called and cancelled the appt for PT. She also had some confusion after taking Norco. He felt she may need placement for rehab. He called the surgeon's office and advised to present to ED. She is on a baby aspirin for DVT prevention.  Hospital Course: Admitted to tele/pulse ox. Metabolic encephalopathy was ruled out. We feel the confusion correlated per husband with confusion after taking Norco 7.5mg , so could be medicine related and residual effect of anesthesia with recent surgery. Ortho consulted. Tylenol and Toradol for pain and robaxin to relaxed her muscles. PT and CM triggered. Her blood cultures were initially called as yeast and was started on fluconazole, and [facility] consulted. Fluconazole was Dc'd by [facility] and put on micafungin. Next day micro called and said the yeast result was incorrect so ruled out, pt did not have yeast in the blood. Micafungin Dc'd. Bilateral knee XRS negative for fracture, Duplex neg for DVT. She has been walking with PT the last several days and has gotten better, walking  further. [facility] refused her due to insurance and she refused SNF. Husband agreeable to take her home and her next PT OP appt is scheduled for Tuesday, Dec 3rd at 8.:45a. No appts available on Monday. She will see ortho per their recommendations. To see PCP in one week.          Physical Exam:  General:  Patient in NAD, husband present, in bedside chair  Head:  Normocephalic, atraumatic  Eyes:  PERRL, anicteric sclera  ENT:  Oral mucosa moist, no nasal discharge   Neck:  Soft, supple, trachea midline  Heart:  RRR, S1 and S2 normal  Lungs:  Unlabored respirations.  Lungs are clear to auscultation bilaterally, with no wheezes, no rales, no conversational dyspnea  Abdomen:  Soft, active bowel sounds, non-tender to palpation, non-distended  Extremities:  Pulses equal bilaterally.  Capillary refill less than 3 seconds.  Mild edema around patellas.  Skin:  Warm and dry, not diaphoretic.  No ecchymosis noted. No redness noted. Bilateral knee dressings  Neuro:  A&O x 3.  No focal deficits.  Speech intact  Psych:  Cooperative, not agitated    CONDITION ON DISCHARGE:  A. Ambulation: Ambulation with assistive device  B. Self-care Ability: Complete  C. Cognitive Status Oriented x 3  D. Code status at discharge: full code      LINES/DRAINS/WOUNDS AT DISCHARGE:   Patient Lines/Drains/Airways Status       Active Line / Dialysis Catheter / Dialysis Graft / Drain / Airway / Wound       Name Placement date Placement time Site Days    Peripheral IV Proximal;Right Basilic  (medial side of arm) 10/05/23  1422  -- 1    Wound  Incision Bilateral Knee 10/03/23  --  -- 4                    DISCHARGE DISPOSITION:  Home discharge  DISCHARGE INSTRUCTIONS:  Post-Discharge Follow Up Appointments       Tuesday Oct 11, 2023    Evaluation with Shirlean Schlein, PT at  8:45 AM      Thursday Oct 13, 2023    Follow Up Appointment with Trilby Leaver, PTA at  4:15 PM      Friday Oct 14, 2023  Follow Up Appointment with Shirlean Schlein, PT at   4:45 PM      Monday Oct 17, 2023    Follow Up Appointment with Tilden Dome, PTA at  3:30 PM      Wednesday Oct 19, 2023    Follow Up Appointment with Shirlean Schlein, PT at  2:30 PM      Friday Oct 21, 2023    Follow Up Appointment with Tilden Dome, PTA at  2:45 PM      Physical Therapy, [facility], [place]   [address]  [place] [address]  [phone]          No discharge procedures on file.       Judene Companion, FNP-BC    Copies sent to Care Team         Relationship Specialty Notifications Start End    Earlean Shawl, DO PCP - General FAMILY MEDICINE  06/07/23     Phone: [phone] Fax: [phone]         [address] [place] [address]            Referring providers can utilize [email or web address] to access their referred [facility] patient's information.

## 2023-10-07 NOTE — PT Treatment (Signed)
[facility] Medicine [facility]  [address]  [place], [address]  [phone]  (Fax) [phone]  Rehabilitation Department  Physical Therapy Daily Inpatient Note    Date: 10/07/2023  Patient's Name: Ashley Reid  Date of Birth: September 25, 1956  Height: Height: 170.2 cm (5\' 7" )  Weight: Weight: 89.8 kg (198 lb)      Plan: Will continue under current POC.      Discharge Disposition: (P) home with assist, home with outpatient services        Subjective/Objective/Assessment:  Flowsheet    10/07/23 1002   Rehab Session   Document Type therapy progress note (daily note)   PT Visit Date 10/07/23   General Information   Patient Profile Reviewed yes   Medical Lines Telemetry   Respiratory Status room air   Existing Precautions/Restrictions fall precautions   Pre Treatment Status   Pre Treatment Patient Status Patient sitting in bedside chair or w/c   Support Present Pre Treatment  None   Communication Pre Treatment  Charge Nurse   Communication Pre Treatment Comment cleared for PT   Pre-Treatment Pain   Pretreatment Pain Rating 7/10   Pre/Posttreatment Pain Comment both knees   Transfer Assessment/Treatment   Sit-Stand Independence contact guard assist   Stand-Sit Independence contact guard assist   Sit-Stand-Sit, Assist Device walker, front wheeled   Transfer Impairments pain;endurance;strength decreased   Gait Assessment/Treatment   Total Distance Ambulated 220   Independence  contact guard assist   Assistive Device  walker, front wheeled   Impairments  endurance;postural control impaired;strength decreased   Comment reciprocal gait, no lob   Balance   Sitting Balance: Static good balance   Sit-to-Stand Balance fair + balance   Standing Balance: Static fair + balance   Therapeutic Exercise   Comment family doing exs with patient   Post Treatment Status   Post Treatment Patient Status Patient sitting in bedside chair or w/c   Support Present Post Treatment  Family present   Communication Post  Treatement MD;Charge Nurse   Patient Effort good   Post-Treatment Pain   Posttreatment Pain Rating 7/10   Cognitive Assessment/Intervention   Behavior/Mood Observations behavior appropriate to situation, WNL/WFL   Physical Therapy Time and Intention   Total PT Minutes: 15   Therapy Plan Review/Discharge Plan (PT)   Anticipated Discharge Disposition home with assist;home with outpatient services     Patient stood with cga of 1, gaited with rw 216ft, nolob, did well with walker, left up in the chair with needs in reach, family prsent during session            Intervention minutes: GAIT TRAINING 15 MINUTES    THERAPIST  Lane Hacker, PTA  10/07/2023, 12:13

## 2023-10-10 ENCOUNTER — Ambulatory Visit (HOSPITAL_COMMUNITY): Payer: Self-pay

## 2023-10-10 LAB — ADULT ROUTINE BLOOD CULTURE, SET OF 2 BOTTLES (BACTERIA AND YEAST)
BLOOD CULTURE, ROUTINE: NO GROWTH
BLOOD CULTURE, ROUTINE: NO GROWTH

## 2023-10-11 ENCOUNTER — Other Ambulatory Visit: Payer: Self-pay

## 2023-10-11 ENCOUNTER — Encounter (HOSPITAL_COMMUNITY): Payer: Self-pay

## 2023-10-11 ENCOUNTER — Inpatient Hospital Stay (HOSPITAL_COMMUNITY)
Admission: RE | Admit: 2023-10-11 | Discharge: 2023-10-11 | Disposition: A | Discharge status: Still In Hospital | Payer: Self-pay | Source: Ambulatory Visit | Attending: Orthopaedic Surgery

## 2023-10-11 DIAGNOSIS — Z96653 Presence of artificial knee joint, bilateral: Secondary | ICD-10-CM | POA: Insufficient documentation

## 2023-10-11 NOTE — PT Evaluation (Signed)
[facility] Medicine [facility]  Outpatient Physical Therapy  [address]  [place], [address]  [phone]  (Fax) [phone]      Physical Therapy Lower Extremity Evaluation    Date: 10/11/2023  Patient's Name: Ashley Reid  Date of Birth: May 11, 1956  Physical Therapy Evaluation     Evaluating Physical Therapist: Shirlean Schlein, PT, DPT  PT diagnosis/Reason for Referral: s/p B TKA  Next Scheduled Physician Appointment: unknown  Allergies/Contraindications: fall risk             SUBJECTIVE  Date of onset: 10/03/2023    Mechanism of injury: B TKA    Current Presentation: Pt presents 8 days s/p B TKA with B knee pain, swelling, decreased strength and ROM. She is able to navigate the 6 STE her home, but recovery has been complicated due to pain.     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    CPAP (continuous positive airway pressure) dependence     Depression     Fibromyalgia     GERD (gastroesophageal reflux disease)     Headache     History of heart murmur in childhood     History of palpitations     "Heart beating hard"    Hyperlipidemia     Insomnia     Neck problem     Neuropathy (CMS HCC)     Osteopenia     Prediabetes     Sleep apnea          Past Surgical History:   Past Surgical History:   Procedure Laterality Date    BREAST CYST EXCISION      HX BACK SURGERY      HX CYST INCISION AND DRAINAGE Right 1974    REMOVED CYST    HX SHOULDER SURGERY Right     HX TONSILLECTOMY      KNEE CARTILAGE SURGERY Right     REPLACEMENT TOTAL KNEE Bilateral     SKIN CANCER EXCISION      (L) leg         Patient goals: REDUCE PAIN and NORMALIZE FUNCTION    Occupation:  retired    Pain location: B knees                    Pain description: ACHING, SHOOTING, and PRESSURE    Pain  frequency:  CONTINUOUS    Pain rating: Now 9   Best 7   Worst 10    Radiculopathy: none noted    Pain increases with: POSITION CHANGE, ADLs, ACTIVITY, EXERCISE, STAND, SIT, WALK, and STAIR CLIMBING           decreases with : COLD and MEDICATION    Sensation: intact to LT    Weakness: B LE    Sleep affected: yes    Subjective Functional Reports:    Sitting: LIMITED    Standing: LIMITED    Walking: LIMITED    Lifting: LIMITED    Personal ADLs: IND      Patient-Specific Functional Score:    Problem Score   1. Getting in and out of chairs 1   2. Walking  in and out of appointments 0   3. Sleeping through the night 0   Total 0.33   Total score = sum of the activity scores/number of activities    Minimal detectable change (90% CI) for avg score = 2 points    Minimal detectable change (90% CI) for single activity score = 3 points             OBJECTIVE    A/PROM   right left   Knee Flexion 90* 87*   Knee Extension 0 0     ROM comments : pain limited end range B knees; tightness noted in B HS, gastrocs, quads, hip flexors, ITB    Strength     right left   Hip flexion  3+ 3+   Hip extension  3+ 3+   Hip abduction 3+ 3+   Knee flexion  2+ 2+   Knee extension  2+ 2+   Ankle DF  3 3   Ankle PF  3 3       Gait: USES ASSISTIVE DEVICE 2WW with minimal knee flexion throughout gait cycle. B trunk lean towards stance phase; able to walk 100' CGA with increased pain    STS: Pt is MinA for STS from standard chair with use of B UE. She has fear with moving her hands from the chair to the walker    Palpation: TTP B knees    Joint mobility:patellar hypomobility noted B    Incisions: both appear to be healing well with sutures intact. She has trace amounts of serosanguinous drainage at B incisions.  Bandages were changed at today's visit (10').     Homan's sign -    TUG: 49 sec with 2WW    SLR: R + knee pain but no quad lag  SLR: L + pain and 5 deg quad lag      Treatment provided:REVIEW OF POC AND GOALS WITH PATIENT, ALL QUESTIONS ANSWERED,  PATIENT EDUCATION, THERAPEUTIC EXERCISE , and bandage change      HEP: Access Code: [email or web address]  URL: [email or web address]  Date: 10/11/2023  Prepared by: Shirlean Schlein    Program Notes  1) Complete exercises at least 3x daily, or more if able2) Get up and walk every 1-2 hours with walker3) Ice for 20 minutes every hour or so as able4) Try to elevate the leg as able for 7-10 days after surgery5) Even on days of therapy, continue to work with exercises, icing, and walking at home!6) Weather the storm!  The first few weeks will be very challenging, then you will see improvements every day and be happy you got this surgery!    Exercises  - Supine Quad Set  - 4 x daily - 7 x weekly - 1 sets - 12 reps - 3 hold  - Supine Heel Slide with Strap  - 4 x daily - 7 x weekly - 1 sets - 12 reps - 3 hold  - Seated Long Arc Quad  - 4 x daily - 7 x weekly - 1 sets - 10 reps  - Seated Heel Toe Raises  - 4 x daily - 7 x weekly - 1 sets - 12 reps - 2 hold  - Seated March  - 4 x daily - 7 x weekly - 1 sets - 10 reps - 2 hold  - Seated Knee Extension Stretch with Chair  - 4 x daily - 7 x weekly - 1 sets - 1 reps - 30 hold  ASSESSMENT    Impression: Pt presents 8 days s/p B TKA with B knee pain, swelling, decreased strength, decreased ROM (R0-90, L0-87), poor balance, and compensatory gait. Aforementioned impairments are interfering with her ability to stand, walk, squat, navigate stairs, dress, sleep, and drive. She will benefit from skilled PT services to optimize post-operative B knee function by reducing pain and swelling, improving strength and ROM, and normalizing balance and gait in order to improve her participation in ADLs and IADLs.     Rehab potential: GOOD    Short Term Goals: 4 Weeks   -B knee ROM 0 -120 to improve transfers, dressing, sleeping.   -Strength of B LE WFL for supine SLR without quad lag.   -Patient will be independent in HEP   -Pt will demo normal gait with LRAD at least 500' without increasing  pain.       Long Term Goals: 8 Weeks  -B LE strength will improve to at least 4+/5 to improve standing, walking, squatting, and stair navigation.   -Pt will report no pain at rest and less than 4/10 pain with ADLs.    -Sleep not disrupted by LE pain.   -LE strength WFL for patient to negotiate steps reciprocally without rail.   -Pt will score at least 5 on PSFS to progress towards pt stated goals and demo improved IND with gait and ADLs.        PLAN  Patient will attend 3 times per week x 8 weeks. Therapy may include, but is not limited to THERAPEUTIC EXERCISES, MYOFASCIAL/JOINT MOBILIZATION, POSTURE/BODY MECHANICS, ERGONOMIC TRAINING, TRANSFER/GAIT TRAINING, HOME INSTRUCTIONS, HEAT/COLD, ELECTRICAL STIMULATION, and KINESIOTAPE    Plan for next visit Initiate with NuStep, progress B knee strength and ROM with seated and supine activities, consider modalities for pain - plan to progress WB activities when pain is less than or equal to 7/10       Evaluation complexity:   Personal factors impacting POC: FREQUENT OR CHRONIC PAIN   Co-morbidities impacting POC:  none  Complexity of physical exam: INCLUDING MUSCULOSKELETAL SYSTEM (POSTURE, ROM, STRENGTH, HEIGHT/WEIGHT) and MD REFERRAL IS FOR MULTIPLE BODY PARTS   Clinical Presentation: STABLE   Evaluation Complexity: LOW-HISTORY 0, EXAMINATION 1-2, STABLE PRESENTATION      Total Session Time 60, Timed code minutes 30, and Untimed code minutes 30         Intervention minutes: EVALUATION 30 minutes and THERAPEUTIC EXERCISE 30 minutes    Shirlean Schlein, PT  10/11/2023, 08:52                Certification:    From:______  Through:_________    I certify the need for these services furnished under this plan of treatment and while under my care.    Referring Provider Signature: _______________     Date : _____________________    Printed name of Referring Provider________________________________________

## 2023-10-12 ENCOUNTER — Ambulatory Visit (HOSPITAL_COMMUNITY): Payer: Self-pay

## 2023-10-13 ENCOUNTER — Ambulatory Visit (HOSPITAL_COMMUNITY)
Admission: RE | Admit: 2023-10-13 | Discharge: 2023-10-13 | Disposition: A | Discharge status: Still In Hospital | Payer: Medicare Other | Source: Ambulatory Visit

## 2023-10-13 NOTE — PT Treatment (Addendum)
[facility] Medicine [facility]  Outpatient Physical Therapy  [address]  [place], [address]  [phone]  (Fax) [phone]    Physical Therapy Treatment Note    Date: 10/13/2023  Patient's Name: Ashley Reid  Date of Birth: 1956/07/10  Physical Therapy Visit            Visit #/POC:2 of 24  Authorization:2 of 24 visits  POC Signed?: No  POC Ends: 2/4  Order Ends: 2/4  Next Progress Note Due: 10th visit or 11/11/23      Evaluating Physical Therapist: Shirlean Schlein, PT  PT diagnosis/Reason for Referral:  s/p B TKA   Next Scheduled Physician Appointment: TBD  Allergies/Contraindications: Fall risk           Subjective: Patient states that she had to go down her steps at home side ways and has increase pain in (L) leg across the top of her knee. States that she was able to ride her  LE bicycle 30 min yesterday and 10 minutes today. States that she is compliant with her HEP. States she was able to put her own pants and shoes on by herself.     Objective: Treatment delivered as follows:     Measured ROM: A/PROM 12/3    right left   Knee Flexion 90* 87*   Knee Extension 0 0     EXERCISE/ACTIVITY NAME REPETITIONS RESISTANCE COMPLETED THIS DOS   Seated elliptical    6 min R1 y   DKTC with PB   x12  y   Bridging with PB X10  y   SLR off PB X10(B)  y   Gentle PROM   y   SAQ X37(B)  y   LAQ X10(B)  y   Heel slides supine X10(B)  y   Cold pack  10 min   y   Amb with RW 120 feet   y   Pt ed cold pack and steps(proper technique)   y         Assessment: Tolerated well. Noted VC with heel strike and knee flexion when ambulating. She does require Min Assist STS from transfer chair. She does have good AROM to only be on her second PT visit.     Short Term Goals: 4 Weeks               -B knee ROM 0 -120 to improve transfers, dressing, sleeping.               -Strength of B LE WFL for supine SLR without quad lag.               -Patient will be independent in HEP               -Pt will demo normal gait with  LRAD at least 500' without increasing pain.         Long Term Goals: 8 Weeks  -B LE strength will improve to at least 4+/5 to improve standing, walking, squatting, and stair navigation.   -Pt will report no pain at rest and less than 4/10 pain with ADLs.                -Sleep not disrupted by LE pain.   -LE strength WFL for patient to negotiate steps reciprocally without rail.               -Pt will score at least 5 on PSFS to progress towards pt stated goals and  demo improved IND with gait and ADLs    Plan: Continue strengthening and mobility ther ex per POC. Progress as tolerated.     Total Session Time 50, Timed code minutes 40, and Untimed code minutes 10  THERAPEUTIC EXERCISE 40 minutes      Trilby Leaver, PTA  10/13/2023, 16:12

## 2023-10-14 ENCOUNTER — Other Ambulatory Visit: Payer: Self-pay

## 2023-10-14 ENCOUNTER — Ambulatory Visit (HOSPITAL_COMMUNITY)
Admission: RE | Admit: 2023-10-14 | Discharge: 2023-10-14 | Disposition: A | Discharge status: Still In Hospital | Payer: Medicare Other | Source: Ambulatory Visit

## 2023-10-14 ENCOUNTER — Ambulatory Visit (HOSPITAL_COMMUNITY): Payer: Self-pay

## 2023-10-14 NOTE — PT Treatment (Signed)
[facility] Medicine [facility]  Outpatient Physical Therapy  [address]  [place], [address]  [phone]  (Fax) [phone]    Physical Therapy Treatment Note    Date: 10/14/2023  Patient's Name: Ashley Reid  Date of Birth: 18-May-1956  Physical Therapy Visit    Visit #/POC:3 of 24  Authorization:2 of 24 visits  POC Signed?: No  POC Ends: 2/4  Order Ends: 2/4  Next Progress Note Due: 10th visit or 11/11/23        Evaluating Physical Therapist: Shirlean Schlein, PT  PT diagnosis/Reason for Referral:  s/p B TKA   Next Scheduled Physician Appointment: TBD  Allergies/Contraindications: Fall risk              Subjective: Patient is getting in and out of chairs much easier now. Pain upon arrival 7/10     Objective: Treatment delivered as follows:      Measured ROM: A/PROM 12/6    right left   Knee Flexion 100* 90*   Knee Extension 0 0      EXERCISE/ACTIVITY NAME REPETITIONS RESISTANCE COMPLETED THIS DOS   Seated elliptical     10 min R3 y   Incline gastroc stretch 3'  y   DKTC with PB    x12   y   Bridging with PB X10   y   Banker X10(B)   y   SLR x10     Gentle PROM  10'   y   SAQ X10(B)   y   LAQ X10(B)   y   Heel slides supine X10(B)   y   Cold pack  10 min    y   Amb with RW 120 feet    y   Pt ed cold pack and steps(proper technique)     n            Assessment: Tolerated well. She still has decreased knee flexion throughout gait cycle. ROM and strength are progressing well.      Short Term Goals: 4 Weeks               -B knee ROM 0 -120 to improve transfers, dressing, sleeping.               -Strength of B LE WFL for supine SLR without quad lag.               -Patient will be independent in HEP               -Pt will demo normal gait with LRAD at least 500' without increasing pain.         Long Term Goals: 8 Weeks  -B LE strength will improve to at least 4+/5 to improve standing, walking, squatting, and stair navigation.   -Pt will report no pain at rest and less than 4/10 pain with ADLs.                 -Sleep not disrupted by LE pain.   -LE strength WFL for patient to negotiate steps reciprocally without rail.               -Pt will score at least 5 on PSFS to progress towards pt stated goals and demo improved IND with gait and ADLs     Plan: Continue strengthening and mobility ther ex per POC. Progress as tolerated.     Total Session Time 40 and Timed code minutes 40  THERAPEUTIC EXERCISE  40 minutes      Shirlean Schlein, PT  10/14/2023, 17:00

## 2023-10-17 ENCOUNTER — Ambulatory Visit (HOSPITAL_COMMUNITY): Payer: Self-pay

## 2023-10-17 ENCOUNTER — Ambulatory Visit (HOSPITAL_COMMUNITY)
Admission: RE | Admit: 2023-10-17 | Discharge: 2023-10-17 | Disposition: A | Discharge status: Still In Hospital | Payer: Medicare Other | Source: Ambulatory Visit

## 2023-10-17 NOTE — PT Treatment (Signed)
[facility] Medicine [facility]  Outpatient Physical Therapy  [address]  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  States she did not rest well last night.  Rates pain 9/10 today.  She will see doctor tomorrow for removal of stitches.  L knee is better than R knee.  States she is doing exercises at home and using ice.  Today she had to take Ativan to help relax her as she felt tense all over.  Pt notes this is first day she has been this painful and this tense.  Pt will see doctor tomorrow    Objective: Nustep followed by activities as noted below.      Measured ROM:  did not measure today      EXERCISE/ACTIVITY NAME REPETITIONS RESISTANCE COMPLETED THIS DOS   Seated elliptical     10 min R3 y   Incline gastroc stretch 3'   n   DKTC with PB    x12   y   Bridging with PB X10   y   Banker X10(B)   y   SLR x10    n   Gentle PROM  10'   y   SAQ X10(B)   y   LAQ X10(B)   n   Heel slides supine X10(B)   n   Cold pack  10 min    y   Amb with RW 120 feet    n   Pt ed cold pack and steps(proper technique)     n        Assessment:  Pt with poor tolerance to activities today.  She did note feeling better when she left therapy vs when she got here.  Pt very painful today with pain behaviors throughout entire session.  Reflexive guarding noted with AA/PROM    Short Term Goals: 4 Weeks               -B knee ROM 0 -120 to improve transfers, dressing,  sleeping.               -Strength of B LE WFL for supine SLR without quad lag.               -Patient will be independent in HEP               -Pt will demo normal gait with LRAD at least 500' without increasing pain.         Long Term Goals: 8 Weeks  -B LE strength will improve to at least 4+/5 to improve standing, walking, squatting, and stair navigation.   -Pt will report no pain at rest and less than 4/10 pain with ADLs.                -  Sleep not disrupted by LE pain.   -LE strength WFL for patient to negotiate steps reciprocally without rail.               -Pt will score at least 5 on PSFS to progress towards pt stated goals and demo improved IND with gait and ADLs      Plan:   Hope to resume full exercise program next visit.      Total Session Time 45, Timed code minutes 35, and Untimed code minutes 10  THERAPEUTIC EXERCISE 35 minutes      Nishawn Rotan, PTA  10/17/2023, 15:32

## 2023-10-19 ENCOUNTER — Ambulatory Visit (HOSPITAL_COMMUNITY): Payer: Self-pay

## 2023-10-19 ENCOUNTER — Ambulatory Visit (HOSPITAL_COMMUNITY)
Admission: RE | Admit: 2023-10-19 | Discharge: 2023-10-19 | Disposition: A | Discharge status: Still In Hospital | Payer: Medicare Other | Source: Ambulatory Visit

## 2023-10-19 DIAGNOSIS — Z96653 Presence of artificial knee joint, bilateral: Secondary | ICD-10-CM

## 2023-10-19 NOTE — PT Treatment (Signed)
[facility] Medicine [facility]  Outpatient Physical Therapy  [address]  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 Lindwood Qua yesterday who is advising her to be a little more aggressive with L knee ROM. Otherwise, she states he was pleased. Sutures were removed, and she is feeling much better today having resumed Celebrex. 6/10 pain upon arrival. Of note, she did pass out at Dr. Lennox Pippins office yesterday.     Objective: Nustep followed by activities as noted below.       Measured ROM:  did not measure today        EXERCISE/ACTIVITY NAME REPETITIONS RESISTANCE COMPLETED THIS DOS   Seated elliptical     10 min R3 y   Incline gastroc stretch 3'   y   DKTC with PB    x12   y   Bridging with PB X10   n   Quad sets X10(B)   y   SLR x10    y   Gentle PROM  33'   y   MFR B quads 5'  y   SAQ X10(B)   y   LAQ X10(B)   y   Heel slides supine X10(B)   y   Cold pack  10 min    y   Step ups X 5 ea 6" y   STS - - n   Amb with RW 120 feet    n   Pt ed cold pack and steps(proper technique)     n         Assessment:  Pt with improved exercise tolerance today. She was able to progress functional strength with step ups, and she reports decreased pain (6/10) after MT today. She is progressing well towards all goals.      Short Term Goals: 4 Weeks               -B knee ROM 0 -120 to improve transfers, dressing, sleeping.               -Strength of B LE WFL for supine SLR without quad lag.                -Patient will be independent in HEP               -Pt will demo normal gait with LRAD at least 500' without increasing pain.         Long Term Goals: 8 Weeks  -B LE strength will improve to at least 4+/5 to improve standing, walking, squatting, and stair navigation.   -Pt will report no pain at rest and less than 4/10 pain with ADLs.                -Sleep not disrupted by LE pain.   -LE strength WFL for patient to negotiate steps reciprocally without rail.               -  Pt will score at least 5 on PSFS to progress towards pt stated goals and demo improved IND with gait and ADLs        Plan:   assess response to treatment and progress as able     Total Session Time 55 and Timed code minutes 55  THERAPEUTIC EXERCISE 40 minutes and JOINT MOBILIZATION/MFR 15 minutes      Shirlean Schlein, PT  10/19/2023, 17:59

## 2023-10-21 ENCOUNTER — Ambulatory Visit (HOSPITAL_COMMUNITY)
Admission: RE | Admit: 2023-10-21 | Discharge: 2023-10-21 | Disposition: A | Discharge status: Still In Hospital | Payer: Medicare Other | Source: Ambulatory Visit

## 2023-10-21 ENCOUNTER — Ambulatory Visit (HOSPITAL_COMMUNITY): Payer: Self-pay

## 2023-10-21 NOTE — PT Treatment (Signed)
[facility] Medicine [facility]  Outpatient Physical Therapy  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  Notes she did have stitches removed this week.  Notes still not sleeping well.  States she has started having some back pain but does have a history of back pain and sciatica     Objective:  Nustep followed by activities as noted below.     Measured ROM:      EXERCISE/ACTIVITY NAME REPETITIONS RESISTANCE COMPLETED THIS DOS   Seated elliptical     6 min R3 y   Incline gastroc stretch 3'   y   DKTC with PB    x12   y   Bridging with PB X10   y   Banker X10(B)   n   SLR x10    n   Gentle PROM  10'   y   MFR B quads 5'   n   SAQ X10(B)   y   LAQ X10(B)   y   Heel slides supine X10(B)   y   Cold pack  10 min    y   Step ups X 5 ea 6" y   STS - - n   Amb with RW 120 feet    n   Pt ed cold pack and steps(proper technique)     n        Assessment: Pt tolerated treatment well.  She remains painful and exhibits weakness.  Improving quad recruitment but not fully returned. More upright posture during ambulation noted today    Short Term Goals: 4 Weeks               -B knee ROM 0 -120 to improve transfers, dressing, sleeping.               -Strength of B LE WFL for supine SLR without quad lag.               -Patient will be independent in HEP               -Pt will demo normal gait with LRAD at least 500' without increasing pain.          Long Term Goals: 8 Weeks  -B LE strength will improve to at least 4+/5 to improve standing, walking, squatting, and stair navigation.   -Pt will report no pain at rest and less than 4/10 pain with ADLs.                -Sleep not disrupted by LE pain.   -LE strength WFL for patient to negotiate steps reciprocally without rail.               -Pt will score at least 5 on PSFS to  progress towards pt stated goals and demo improved IND with gait and ADLs            Plan:  Will continue and progress as tolerated     Total Session Time 50, Timed code minutes 40, and Untimed code minutes 10  THERAPEUTIC EXERCISE 40 minutes      Ramanda Paules, PTA  10/21/2023, 14:47

## 2023-10-24 ENCOUNTER — Ambulatory Visit (HOSPITAL_COMMUNITY)
Admission: RE | Admit: 2023-10-24 | Discharge: 2023-10-24 | Disposition: A | Discharge status: Still In Hospital | Payer: Medicare Other | Source: Ambulatory Visit

## 2023-10-24 NOTE — PT Treatment (Signed)
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  States she can get in/out of car easier.  Notes she has been walking more at home.  Reports pain is a little better as well.  She rates pain 7/10 today.  She will go to see PA next week for recheck as pt notes decreased extension.     Objective:  activities as noted below.     Measured ROM:  R edge of bed 104 active 110 passive, , L 100 active 105 passive     EXERCISE/ACTIVITY NAME REPETITIONS RESISTANCE COMPLETED THIS DOS   Seated elliptical     6 min R3 y   Incline gastroc stretch 3'   y   Step stretch flexion  Extension   20 sec x 5 ea   20 sec x 5 ea   Y   Y                DKTC with PB    x12   n   Bridging with PB X10   n   Quad sets X10(B)   Y on bolster   SLR x10    y   Gentle PROM  44'   y   MFR B quads 5'   n   SAQ X10(B)   y   LAQ X10(B)   y   Heel slides supine X10(B)   n   Cold pack  10 min    y   Step ups X 5 ea 6" n   STS - - n   Amb with RW 120 feet    n   Pt ed cold pack and steps(proper technique)     n      Assessment:  Pt tolerated treatment well.  She does have pain behaviors with all activities but has very little reflexive guarding.  Incisions are both very scabbed and dry.  Some redness noted but no outright sign of infection.  Pt is advised to watch for any sign of drainage or incisions opening     Short Term Goals: 4  Weeks               -B knee ROM 0 -120 to improve transfers, dressing, sleeping.               -Strength of B LE WFL for supine SLR without quad lag.               -Patient will be independent in HEP               -Pt will demo normal gait with LRAD at least 500' without increasing pain.         Long  Term Goals: 8 Weeks  -B LE strength will improve to at least 4+/5 to improve standing, walking, squatting, and stair navigation.   -Pt will report no pain at rest and less than 4/10 pain with ADLs.                -Sleep not disrupted by LE pain.   -LE strength WFL for patient to negotiate steps reciprocally without rail.               -Pt will score at least 5 on PSFS to progress towards pt stated goals and demo improved IND with gait and ADLs         Plan:   Will continue and progress as tolerated     Total Session Time 45, Timed code minutes 35, and Untimed code minutes 10  THERAPEUTIC EXERCISE 35 minutes      Moritz Lever, PTA  10/24/2023, 15:27

## 2023-10-26 ENCOUNTER — Ambulatory Visit (HOSPITAL_COMMUNITY)
Admission: RE | Admit: 2023-10-26 | Discharge: 2023-10-26 | Disposition: A | Discharge status: Still In Hospital | Payer: Medicare Other | Source: Ambulatory Visit

## 2023-10-26 NOTE — PT Treatment (Signed)
[facility] Medicine [facility]  Outpatient Physical Therapy  [address]  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  States the tightness is improving.  Pt notes still painful, still difficult to get comfortable.  Reports she has a lot of itching especially at night    Objective:   Activities as noted below.      Measured ROM:   EXERCISE/ACTIVITY NAME REPETITIONS RESISTANCE COMPLETED THIS DOS   Seated elliptical     6 min R3 y   Incline gastroc stretch 3'   y   Step stretch flexion  Extension   20 sec x 5 ea   20 sec x 5 ea    Y   Y                        DKTC with PB    x12   n   Bridging with PB X10   n   Quad sets X10(B)   N on bolster   SLR x10    n   Gentle PROM  10'   y   MFR B quads 5'   n   SAQ X10(B)   n   LAQ X10(B)   y   Heel slides supine X10(B)   n   Cold pack  10 min    y   Step ups  Lateral step up X 10 ea  X 10 each 6"  6" Y  y   STS 10 19 inch table y   Amb with cane 120 feet    y   Pt ed cold pack and steps(proper technique)     n          Assessment: Pt tolerated treatment well.  She is progressing nicely with ROM.  Strength and pain slowly improving.  She did well today with use of straight cane although is still apprehensive.  ROM is satisfactory       Short Term Goals: 4 Weeks               -B knee ROM 0 -120 to improve transfers, dressing, sleeping.               -Strength of B LE WFL for supine SLR without quad lag.                -Patient will be independent in HEP               -Pt will demo normal gait with LRAD at least 500' without increasing pain.         Long Term Goals: 8 Weeks  -B LE strength will improve to at least 4+/5 to improve standing, walking, squatting, and stair navigation.   -Pt  will report no pain at rest and less than 4/10 pain with ADLs.                -Sleep not disrupted by LE pain.   -LE strength WFL for patient to negotiate steps reciprocally without rail.               -Pt will score at least 5 on PSFS to progress towards pt stated goals and demo improved IND with gait and ADLs            Plan:   Will continue and progress as tolerated    Total Session Time 40 and Timed code minutes 40  THERAPEUTIC EXERCISE 40 minutes      Dmitri Pettigrew, PTA  10/26/2023, 14:46

## 2023-10-27 ENCOUNTER — Other Ambulatory Visit (HOSPITAL_COMMUNITY): Payer: Self-pay | Admitting: Orthopaedic Surgery

## 2023-10-27 DIAGNOSIS — Z96653 Presence of artificial knee joint, bilateral: Secondary | ICD-10-CM

## 2023-10-28 ENCOUNTER — Ambulatory Visit (HOSPITAL_COMMUNITY)
Admission: RE | Admit: 2023-10-28 | Discharge: 2023-10-28 | Disposition: A | Discharge status: Still In Hospital | Payer: Medicare Other | Source: Ambulatory Visit

## 2023-10-28 ENCOUNTER — Other Ambulatory Visit: Payer: Self-pay

## 2023-10-28 NOTE — Progress Notes (Signed)
[facility] Medicine [facility]  Outpatient Physical Therapy  [address]  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  Her pain is down to a 5/10 upon arrival and she is sleeping well now. She is compliant with HEP.     Patient-Specific Functional Score:     Problem Score   1. Getting in and out of chairs 5   2. Walking in and out of appointments 6   3. Sleeping through the night 7   Total 6   Total score = sum of the activity scores/number of activities    Minimal detectable change (90% CI) for avg score = 2 points    Minimal detectable change (90% CI) for single activity score = 3 points                                                                                       OBJECTIVE     A/PROM    right left   Knee Flexion 108 deg 108 deg   Knee Extension 0 0         EXERCISE/ACTIVITY NAME REPETITIONS RESISTANCE COMPLETED THIS DOS   Seated elliptical     6 min R3 y   Incline gastroc stretch 3'   y   Step stretch flexion  Extension   20 sec x 5 ea   20 sec x 5 ea    Y   Y    DKTC with PB    x12   n   Bridging X10   y   Banker X10(B)   N on bolster   SLR X10 ea    y   End range PROM  29'   y   MFR B quads 5'   n   SAQ X10(B)   y   LAQ X10(B)   y   Heel slides supine X10(B)   y   Cold pack  10 min    n   Step ups  Lateral step up X 10 ea  X 10 each 6"  6" Y  n   STS 10 19 inch table y   Amb with cane 120 feet    n   Pt ed cold pack and  steps(proper technique)     n            Assessment: Pt tolerated treatment well.  She is progressing nicely with ROM, strength, and gait. She can now perform STS  without UE. Anticipate she will achieve most goals over the next 4 weeks. L knee incision is well healed, and R knee incision is 90% healed. She will f/u with orthopedics next week.         Short Term Goals: 4 Weeks               -B knee ROM 0 -120 to improve transfers, dressing, sleeping. (PROGRESSING 10/28/2023)               -Strength of B LE WFL for supine SLR without quad lag. (MET 10/28/2023)               -Patient will be independent in HEP (MET 10/28/2023)                -Pt will demo normal gait with LRAD at least 500' without increasing pain. (PROGRESSING 10/28/2023)        Long Term Goals: 8 Weeks  -B LE strength will improve to at least 4+/5 to improve standing, walking, squatting, and stair navigation. (PROGRESSING 10/28/2023)  -Pt will report no pain at rest and less than 4/10 pain with ADLs. (PROGRESSING 10/28/2023)               -Sleep not disrupted by LE pain. (MET 10/28/2023)  -LE strength WFL for patient to negotiate steps reciprocally without rail. (PROGRESSING 10/28/2023)               -Pt will score at least 5 on PSFS to progress towards pt stated goals and demo improved IND with gait and ADLs (MET 10/28/2023)        Plan:   Will continue and progress as tolerated    Total Session Time 40 and Timed code minutes 40  THERAPEUTIC EXERCISE 40 minutes      Shirlean Schlein, PT  10/28/2023, 15:30

## 2023-10-31 ENCOUNTER — Ambulatory Visit
Admission: RE | Admit: 2023-10-31 | Discharge: 2023-10-31 | Disposition: A | Discharge status: Still In Hospital | Payer: Medicare Other | Source: Ambulatory Visit

## 2023-10-31 NOTE — PT Treatment (Signed)
[facility] Medicine [facility]  Outpatient Physical Therapy  [address]  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  States she has been very busy this morning so is a little sore and rates pain 4-5/10.  Notes she is doing exercise at home as advised.  States overall everything is feeling better.  Still some bad moments but feels like she is doing much better.      Objective:  activities as noted below.       A/PROM    right left   Knee Flexion 108 deg 108 deg   Knee Extension 0 0           EXERCISE/ACTIVITY NAME REPETITIONS RESISTANCE COMPLETED THIS DOS   Seated elliptical     6 min R3 y   Incline gastroc stretch 3'   y   Step stretch flexion  Extension   20 sec x 5 ea   20 sec x 5 ea    Y   Y    DKTC with PB    x12   n   Bridging X10   y   Banker X10(B)   N on bolster   SLR X10 ea    y   End range PROM  55'   y   MFR B quads 5'   n   SAQ X10(B)   y   LAQ X10(B)   y   Heel slides supine X10(B)   y   Cold pack  10 min    n   Step ups  Lateral step up X 10 ea  X 10 each 6"  6" Y  n   STS 10 19 inch table y   Amb with cane 120 feet    n   Pt ed cold pack and steps(proper technique)     n             Assessment:  Pt tolerated treatment well.  She does fatigue very quickly with exercises.  Notes weakness with all activities however overall progressing nicely.  Improved function and improved subjective complaints noted.        Short Term Goals: 4  Weeks               -B knee ROM 0 -120 to improve transfers, dressing, sleeping. (PROGRESSING 10/28/2023)               -Strength of B LE WFL for supine SLR without quad lag. (MET 10/28/2023)               -Patient will be independent in HEP (MET 10/28/2023)                -Pt will demo normal gait with LRAD at least 500' without increasing pain. (PROGRESSING 10/28/2023)  Long Term Goals: 8 Weeks  -B LE strength will improve to at least 4+/5 to improve standing, walking, squatting, and stair navigation. (PROGRESSING 10/28/2023)  -Pt will report no pain at rest and less than 4/10 pain with ADLs. (PROGRESSING 10/28/2023)               -Sleep not disrupted by LE pain. (MET 10/28/2023)  -LE strength WFL for patient to negotiate steps reciprocally without rail. (PROGRESSING 10/28/2023)               -Pt will score at least 5 on PSFS to progress towards pt stated goals and demo improved IND with gait and ADLs (MET 10/28/2023)           Plan:    Will continue and progress as tolerated     Total Session Time 45, Timed code minutes 35, and Untimed code minutes 10  THERAPEUTIC EXERCISE 35 minutes      Tristram Milian, PTA  10/31/2023, 11:03

## 2023-11-04 ENCOUNTER — Other Ambulatory Visit: Payer: Self-pay

## 2023-11-04 ENCOUNTER — Ambulatory Visit
Admission: RE | Admit: 2023-11-04 | Discharge: 2023-11-04 | Disposition: A | Discharge status: Still In Hospital | Payer: Medicare Other | Source: Ambulatory Visit | Attending: Orthopaedic Surgery | Admitting: Orthopaedic Surgery

## 2023-11-04 NOTE — PT Treatment (Signed)
[facility] Medicine [facility]  Outpatient Physical Therapy  [address]  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  She is still reporting 4/10 B knee stiffness which she attributes to the weather.     Objective:  activities as noted below.         A/PROM    right left   Knee Flexion 116 deg 120 deg   Knee Extension 0 0           EXERCISE/ACTIVITY NAME REPETITIONS RESISTANCE COMPLETED THIS DOS   Seated elliptical     6 min R3 y   Incline gastroc stretch 3'   y   Step stretch flexion  Extension   20 sec x 5 ea   20 sec x 5 ea    Y   Y    Prone quad stretch 3'/3'   y   Bridging X10   y   Banker X10(B)   N on bolster   SLR X10 ea  2.5#  y   End range PROM  16'   y   MFR B quads 5'   n   SAQ X10(B)  2.5# y   LAQ X10(B)  2.5# y   Heel slides supine X10(B)   y   Cold pack  10 min    n   Step ups  Lateral step up X 10 ea  X 10 each 6"  6" Y  n   STS 10 19 inch table y               Assessment:  Pt tolerated treatment well.  She demos improved B knee flexion ROM following addition of prone quad stretching today.     Short Term Goals: 4 Weeks               -B knee ROM 0 -120 to improve transfers, dressing, sleeping. (PROGRESSING 10/28/2023)               -Strength of B LE WFL for supine SLR without quad lag. (MET 10/28/2023)               -Patient will be independent in HEP (MET 10/28/2023)                -Pt will demo normal gait with LRAD at least 500'  without increasing pain. (PROGRESSING 10/28/2023)        Long Term Goals: 8 Weeks  -B LE strength will improve to at least 4+/5 to improve standing, walking, squatting, and stair navigation. (PROGRESSING 10/28/2023)  -Pt will report no pain at rest and less than 4/10 pain with ADLs. (PROGRESSING 10/28/2023)               -Sleep not disrupted by LE pain. (MET 10/28/2023)  -LE strength WFL for patient to  negotiate steps reciprocally without rail. (PROGRESSING 10/28/2023)               -Pt will score at least 5 on PSFS to progress towards pt stated goals and demo improved IND with gait and ADLs (MET 10/28/2023)              Plan:    Will continue and progress as tolerated        Total Session Time 40 and Timed code minutes 40  THERAPEUTIC EXERCISE 40 minutes      Shirlean Schlein, PT  11/04/2023, 17:42

## 2023-11-07 ENCOUNTER — Ambulatory Visit (HOSPITAL_COMMUNITY): Payer: Self-pay

## 2023-11-10 ENCOUNTER — Ambulatory Visit (HOSPITAL_COMMUNITY)
Admission: RE | Admit: 2023-11-10 | Discharge: 2023-11-10 | Disposition: A | Discharge status: Still In Hospital | Payer: Medicare Other | Source: Ambulatory Visit

## 2023-11-10 ENCOUNTER — Other Ambulatory Visit: Payer: Self-pay

## 2023-11-10 DIAGNOSIS — Z96653 Presence of artificial knee joint, bilateral: Secondary | ICD-10-CM | POA: Insufficient documentation

## 2023-11-10 NOTE — PT Treatment (Addendum)
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  She is still reporting 4/10 B knee stiffness and burning and is still having difficulty getting up from the commode. F/u with orthopedics went well.      Objective:  activities as noted below.         A/PROM    right left   Knee Flexion 118 deg 122 deg   Knee Extension 0 0           EXERCISE/ACTIVITY NAME REPETITIONS RESISTANCE COMPLETED THIS DOS   Seated elliptical     6 min R3 y   Incline gastroc stretch 3'   y   Step stretch flexion  Extension   20 sec x 5 ea   20 sec x 5 ea    Y   Y    Prone quad stretch 3'/3'   y   Bridging X10   y   SLR X10 ea  2.5#  y   End range PROM  1'   y   MFR B quads 5'   n   SAQ X10(B)  2.5# n   LAQ X10(B)  2.5# y   Heel slides supine X10(B)   y   Cold pack  10 min    n   Step ups  Lateral step up X 10 ea  X 10 each 6"  6" Y  n   STS 3 x 10 Various hts.  y            Assessment:  Pt tolerated treatment well.  She is limited by pain and fear with STS transitions from lower hts. She continues to progress well towards all goals. She is able to ambulate safely throughout clinic without AD today.     Short Term Goals: 4 Weeks               -B knee ROM 0 -120 to improve transfers, dressing, sleeping. (PROGRESSING 10/28/2023)               -Strength of B LE WFL for supine SLR without quad lag. (MET 10/28/2023)                -Patient will be independent in HEP (MET 10/28/2023)                -Pt will demo normal gait with LRAD at least 500' without increasing pain. (PROGRESSING 10/28/2023)        Long Term Goals: 8 Weeks  -B LE strength will improve to at least 4+/5 to improve standing, walking, squatting, and stair navigation. (PROGRESSING 10/28/2023)  -Pt will report no pain at rest and less than 4/10 pain with ADLs. (PROGRESSING 10/28/2023)               -  Sleep not disrupted by LE pain. (MET 10/28/2023)  -LE strength WFL for patient to negotiate steps reciprocally without rail. (PROGRESSING 10/28/2023)               -Pt will score at least 5 on PSFS to progress towards pt stated goals and demo improved IND with gait and ADLs (MET 10/28/2023)       PLAN: assess response to treatment and progress as able. Consider step up and overs    Total Session Time 40 and Timed code minutes 40  THERAPEUTIC EXERCISE 40 minutes      Shirlean Schlein, PT  11/10/2023, 13:55

## 2023-11-11 ENCOUNTER — Ambulatory Visit (HOSPITAL_COMMUNITY)
Admission: RE | Admit: 2023-11-11 | Discharge: 2023-11-11 | Disposition: A | Discharge status: Still In Hospital | Payer: Medicare Other | Source: Ambulatory Visit

## 2023-11-11 ENCOUNTER — Other Ambulatory Visit: Payer: Self-pay

## 2023-11-11 NOTE — PT Treatment (Signed)
[facility] Medicine [facility]  Outpatient Physical Therapy  [address]  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 She is still having soreness, but noted less difficulty getting goff the commode last night.      Objective:  activities as noted below.         A/PROM    right left   Knee Flexion 120 deg 122 deg   Knee Extension 0 0           EXERCISE/ACTIVITY NAME REPETITIONS RESISTANCE COMPLETED THIS DOS   Seated elliptical     6 min R3 y   Incline gastroc stretch 3'   y   Step stretch flexion  Extension   20 sec x 5 ea   20 sec x 5 ea    Y   Y    Prone quad stretch 3'/3'   y   Bridging X10   y   SLR X10 ea  3#  y   End range PROM  38'   y   MFR B quads 5'   n   SAQ X10(B)  3# y   LAQ X10(B)  3# y   Heel slides supine X10(B)   y   Cold pack  10 min    n   Step up and overs X 10 ea 10' Y   Leg press 3 x 10 60# y   STS 3 x 10 Various hts.  n   HS stretchin 2'/2'  y            Assessment:  Pt tolerated treatment well with progression to machine strengthening. She reports decreased pain after treatment and demos safe gait without AD throughout treatment.      Short Term Goals: 4 Weeks               -B knee ROM 0 -120 to improve transfers, dressing, sleeping. (PROGRESSING 10/28/2023)               -Strength of B LE WFL for supine SLR without quad lag. (MET 10/28/2023)               -Patient will be independent in HEP (MET 10/28/2023)                -Pt will  demo normal gait with LRAD at least 500' without increasing pain. (PROGRESSING 10/28/2023)        Long Term Goals: 8 Weeks  -B LE strength will improve to at least 4+/5 to improve standing, walking, squatting, and stair navigation. (PROGRESSING 10/28/2023)  -Pt will report no pain at rest and less than 4/10 pain with ADLs. (PROGRESSING 10/28/2023)               -Sleep not disrupted by LE pain. (MET 10/28/2023)  -LE strength WFL for patient to negotiate  steps reciprocally without rail. (PROGRESSING 10/28/2023)               -Pt will score at least 5 on PSFS to progress towards pt stated goals and demo improved IND with gait and ADLs (MET 10/28/2023)    Plan:  assess response to treatment and progress as able    Total Session Time 40 and Timed code minutes 40  THERAPEUTIC EXERCISE 40 minutes      Shirlean Schlein, PT  11/11/2023, 15:27

## 2023-11-14 ENCOUNTER — Other Ambulatory Visit (HOSPITAL_COMMUNITY): Payer: Self-pay | Admitting: Orthopaedic Surgery

## 2023-11-14 ENCOUNTER — Ambulatory Visit (HOSPITAL_COMMUNITY): Payer: Self-pay

## 2023-11-14 DIAGNOSIS — Z96653 Presence of artificial knee joint, bilateral: Secondary | ICD-10-CM

## 2023-11-18 ENCOUNTER — Ambulatory Visit (HOSPITAL_COMMUNITY)
Admission: RE | Admit: 2023-11-18 | Discharge: 2023-11-18 | Disposition: A | Discharge status: Still In Hospital | Payer: Medicare Other | Source: Ambulatory Visit

## 2023-11-18 DIAGNOSIS — Z96653 Presence of artificial knee joint, bilateral: Secondary | ICD-10-CM

## 2023-11-18 NOTE — PT Treatment (Signed)
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  Rates pain 5-6/10 today.  States she slept good last night and woke up feeling good.  Pt states she is doing exercises at home as advised.      Objective:  Activities as noted below.       EXERCISE/ACTIVITY NAME REPETITIONS RESISTANCE COMPLETED THIS DOS   Seated elliptical     7 min R3 y   Incline gastroc stretch 3'   y   Step stretch flexion  Extension   20 sec x 5 ea   20 sec x 5 ea    Y   Y    Prone quad stretch  Prone HS curl 3'/3'    2 x 10 each   N    Y    Bridging  Gluteal bridge  Bridge on ball X15  X 15  X 15   Y  Y  y   SLR X10 ea  3#  y   End range PROM  77'   y   MFR B quads 5'   n   SAQ 2 x 10(B)  3# y   LAQ 2 x 10(B)  3# y   Heel slides supine X10(B)   n   Cold pack  10 min    n   Step up and overs X 10 ea 10' Y   Leg press  Hip abduction machine 3 x 10  3 x 10 60#  40# Y  y   STS 3 x 10 Various hts.  y   HS stretchin 2'/2'   n        Assessment:  Pt tolerated well.  She shows improvement with most things however sit-stand is still difficult. And supine Heel slide is still very challenging.  ROM is satisfactory and strength steadily improving     hort Term Goals: 4 Weeks               -B knee ROM 0 -120 to improve transfers, dressing, sleeping. (PROGRESSING 10/28/2023)               -Strength of B LE WFL  for supine SLR without quad lag. (MET 10/28/2023)               -Patient will be independent in HEP (MET 10/28/2023)                -Pt will demo normal gait with LRAD at least 500' without increasing pain. (PROGRESSING 10/28/2023)        Long Term Goals: 8 Weeks  -B LE strength will improve to at least 4+/5 to improve standing, walking, squatting, and stair navigation. (PROGRESSING  10/28/2023)  -Pt will report no pain at rest and less than 4/10 pain with ADLs. (PROGRESSING 10/28/2023)               -Sleep not disrupted by LE pain. (MET 10/28/2023)  -LE strength WFL for patient to negotiate steps reciprocally without rail. (PROGRESSING 10/28/2023)               -Pt will score at least 5 on PSFS to progress towards pt stated goals and demo improved IND with gait and ADLs (MET 10/28/2023)         Plan:   Will continue and progress as tolerated    Total Session Time 45 and Timed code minutes 45  THERAPEUTIC EXERCISE 45 minutes      Mattilynn Forrer, PTA  11/18/2023, 10:16

## 2023-11-21 ENCOUNTER — Other Ambulatory Visit (HOSPITAL_COMMUNITY): Payer: Self-pay

## 2023-11-21 DIAGNOSIS — Z1231 Encounter for screening mammogram for malignant neoplasm of breast: Secondary | ICD-10-CM

## 2023-11-22 ENCOUNTER — Ambulatory Visit (HOSPITAL_COMMUNITY)
Admission: RE | Admit: 2023-11-22 | Discharge: 2023-11-22 | Disposition: A | Discharge status: Still In Hospital | Payer: Medicare Other | Source: Ambulatory Visit

## 2023-11-22 ENCOUNTER — Other Ambulatory Visit: Payer: Self-pay

## 2023-11-22 NOTE — Progress Notes (Signed)
[facility] Medicine [facility]  Outpatient Physical Therapy  [address]  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 She is feeling better week to week, but she still is stiff initially upon standing. She reports 80% subjective improvement.      Objective:  Activities as noted below.      Patient-Specific Functional Score:     Problem Score   1. Getting in and out of chairs 6   2. Walking in and out of appointments 8   3. Sleeping through the night 10   Total 8   Total score = sum of the activity scores/number of activities    Minimal detectable change (90% CI) for avg score = 2 points    Minimal detectable change (90% CI) for single activity score = 3 points                                                                                       OBJECTIVE     A/PROM    right left   Knee Flexion 120 deg 124 deg   Knee Extension 0 0     Strength: B knee ext 4/5; B knee flexion 5/5     EXERCISE/ACTIVITY NAME REPETITIONS RESISTANCE COMPLETED THIS DOS   Seated elliptical     7 min R3 y   Incline gastroc stretch 3'   y   Step stretch flexion  Extension   20 sec x 5 ea   20 sec x 5 ea    Y   Y    Prone quad stretch  Prone HS curl 3'/3'     2 x 10 each   N     N   Bridging  Gluteal bridge  Bridge on ball X15  X 15  X 15   Y  Y  y   SLR X10 ea  3#  y   End range PROM  57'   y   MFR B quads 5'   n   SAQ 2 x 10(B)  3# y   LAQ 2 x 10(B)  3#  y   Heel slides supine X10(B)   y   Cold pack  10 min    n   Step up and overs X 10 ea No UE Y   Leg press  Hip abduction machine 2 x 10, 2 x 15  3 x 10 120#/40#  40# Y  y   STS 2  x 10 Various hts.  y   HS stretching 2'/2'   y         Assessment:  Pt is progressing well towards all goals. She continues to demo improved B knee strength and ROM; swelling and pain continue to improve. She still has some difficulty with STS transfers and navigating stairs without UE. Anticipate she will achieve all goals in the upcoming 4 weeks.      hort Term Goals: 4 Weeks               -B knee ROM 0 -120 to improve transfers, dressing, sleeping. (MET 11/22/2023)               -Strength of B LE WFL for supine SLR without quad lag. (MET 10/28/2023)               -Patient will be independent in HEP (MET 10/28/2023)                -Pt will demo normal gait with LRAD at least 500' without increasing pain. (MET 11/22/2023)        Long Term Goals: 8 Weeks  -B LE strength will improve to at least 4+/5 to improve standing, walking, squatting, and stair navigation. (PROGRESSING 11/22/2023)  -Pt will report no pain at rest and less than 4/10 pain with ADLs. (PROGRESSING 11/22/2023)               -Sleep not disrupted by LE pain. (MET 10/28/2023)  -LE strength WFL for patient to negotiate steps reciprocally without rail. (PROGRESSING 11/22/2023)               -Pt will score at least 5 on PSFS to progress towards pt stated goals and demo improved IND with gait and ADLs (MET 10/28/2023)        Plan:   Will continue and progress as tolerated       Total Session Time 40 and Timed code minutes 40  THERAPEUTIC EXERCISE 40 minutes      Shirlean Schlein, PT  11/22/2023, 14:21

## 2023-11-24 ENCOUNTER — Other Ambulatory Visit: Payer: Self-pay

## 2023-11-24 ENCOUNTER — Ambulatory Visit (HOSPITAL_COMMUNITY)
Admission: RE | Admit: 2023-11-24 | Discharge: 2023-11-24 | Disposition: A | Discharge status: Still In Hospital | Payer: Medicare Other | Source: Ambulatory Visit

## 2023-11-24 NOTE — PT Treatment (Signed)
[facility] Medicine [facility]  Outpatient Physical Therapy  [address]  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 She is still having a hard time getting out of chairs.      Objective:  Activities as noted below.      Patient-Specific Functional Score:     Problem Score   1. Getting in and out of chairs 6   2. Walking in and out of appointments 8   3. Sleeping through the night 10   Total 8   Total score = sum of the activity scores/number of activities    Minimal detectable change (90% CI) for avg score = 2 points    Minimal detectable change (90% CI) for single activity score = 3 points                                                                                       OBJECTIVE     A/PROM    right left   Knee Flexion 120 deg 124 deg   Knee Extension 0 0     Strength: B knee ext 4/5; B knee flexion 5/5     EXERCISE/ACTIVITY NAME REPETITIONS RESISTANCE COMPLETED THIS DOS   Seated elliptical     7 min R3 y   Incline gastroc stretch 3'   y   Step stretch flexion  Extension   20 sec x 5 ea   20 sec x 5 ea    Y   Y    Prone quad stretch  Prone HS curl 3'/3'     2 x 10 each   y     N   Bridging  Gluteal bridge  Bridge on ball X15  X 15  X 15   N  N  n   SLR X10 ea  3#  y   End range PROM  75'   y   MFR B quads 5'   n   SAQ 2 x 10(B)  3# y   LAQ 2 x 10(B)  3# y   Heel slides supine X10(B)   y   Cold pack  10 min    n   Step up and overs X 10 ea No UE  Y   Leg press  Hip abduction machine 2 x 10, 2 x 15  3 x 10 120#/40#  40# Y  y   STS 2 x 10 Various hts.  y   HS stretching 2'/2'  y         Assessment:  Pt continues to progress well towards all goals. She has some lingering B quad weakness which is most pronounced at 90 deg knee flexion and is interfering with her ability to perform STS transfers without UE.       hort Term Goals: 4 Weeks               -B knee ROM 0 -120 to improve transfers, dressing, sleeping. (MET 11/22/2023)               -Strength of B LE WFL for supine SLR without quad lag. (MET 10/28/2023)               -Patient will be independent in HEP (MET 10/28/2023)                -Pt will demo normal gait with LRAD at least 500' without increasing pain. (MET 11/22/2023)        Long Term Goals: 8 Weeks  -B LE strength will improve to at least 4+/5 to improve standing, walking, squatting, and stair navigation. (PROGRESSING 11/22/2023)  -Pt will report no pain at rest and less than 4/10 pain with ADLs. (PROGRESSING 11/22/2023)               -Sleep not disrupted by LE pain. (MET 10/28/2023)  -LE strength WFL for patient to negotiate steps reciprocally without rail. (PROGRESSING 11/22/2023)               -Pt will score at least 5 on PSFS to progress towards pt stated goals and demo improved IND with gait and ADLs (MET 10/28/2023)        Plan:   Will continue and progress as tolerated    Total Session Time 40 and Timed code minutes 40  THERAPEUTIC EXERCISE 40 minutes      Shirlean Schlein, PT  11/24/2023, 13:51

## 2023-11-25 ENCOUNTER — Encounter (HOSPITAL_COMMUNITY): Payer: Self-pay

## 2023-11-25 ENCOUNTER — Ambulatory Visit
Admission: RE | Admit: 2023-11-25 | Discharge: 2023-11-25 | Disposition: A | Discharge status: Home / Self Care | Payer: Medicare Other | Source: Ambulatory Visit

## 2023-11-25 DIAGNOSIS — Z1231 Encounter for screening mammogram for malignant neoplasm of breast: Secondary | ICD-10-CM | POA: Insufficient documentation

## 2023-11-30 ENCOUNTER — Ambulatory Visit (HOSPITAL_COMMUNITY)
Admission: RE | Admit: 2023-11-30 | Discharge: 2023-11-30 | Disposition: A | Discharge status: Still In Hospital | Payer: Medicare Other | Source: Ambulatory Visit

## 2023-11-30 NOTE — PT Treatment (Signed)
[facility] Medicine [facility]  Outpatient Physical Therapy  [address]  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  States still very difficult sit-stand and getting out of car.      Objective: Activities as noted below.  Focused treatment on progressive strengthening     Measured ROM:     EXERCISE/ACTIVITY NAME REPETITIONS RESISTANCE COMPLETED THIS DOS   Seated elliptical     7 min R3 y   Incline gastroc stretch 3'   n   Step stretch flexion  Extension   20 sec x 5 ea   20 sec x 5 ea    N  N    Prone quad stretch  Prone HS curl 3'/3'     2 x 10 each   N      N   Bridging  Gluteal bridge  Bridge on ball X15  X 15  X 15   N  N  n   SLR X10 ea  3#  y   End range PROM  10'   n   MFR B quads 5'   n   SAQ 2 x 10(B)  3# N    LAQ 2 x 10(B)  3# y   Heel slides supine X10(B)   n   Cold pack  10 min    n   Step up and overs X 10 ea No UE Y   Leg press  Hip abduction machine 2 x 10, 2 x 15  3 x 10 110#/50#  40# Y  y   STS  Staggered stance STS 2 x 10    10 each Various hts.  Y    Y    HS stretching 2'/2'   y        Assessment: Pt tolerated well.  She notes fatigue at end of session but no pain.  Pt still exhibits weakness with transitions and end range quad strength.     Short Term Goals: 4 Weeks               -B knee ROM 0 -120 to improve transfers, dressing, sleeping. (MET 11/22/2023)               -Strength of B LE WFL for supine SLR without quad lag. (MET  10/28/2023)               -Patient will be independent in HEP (MET 10/28/2023)                -Pt will demo normal gait with LRAD at least 500' without increasing pain. (MET 11/22/2023)        Long Term Goals: 8 Weeks  -B LE strength will improve to at least 4+/5 to improve standing, walking, squatting, and stair navigation. (PROGRESSING  11/22/2023)  -Pt will report no pain at rest and less than 4/10 pain with ADLs. (PROGRESSING 11/22/2023)               -Sleep not disrupted by LE pain. (MET 10/28/2023)  -LE strength WFL for patient to negotiate steps reciprocally without rail. (PROGRESSING 11/22/2023)               -Pt will score at least 5 on PSFS to progress towards pt stated goals and demo improved IND with gait and ADLs (MET 10/28/2023)         Plan:  Will continue and progress as tolerated     Total Session Time 38 and Timed code minutes 38  THERAPEUTIC EXERCISE 38 minutes      Benjamim Harnish, PTA  11/30/2023, 14:50

## 2023-12-02 ENCOUNTER — Ambulatory Visit
Admission: RE | Admit: 2023-12-02 | Discharge: 2023-12-02 | Disposition: A | Discharge status: Still In Hospital | Payer: Medicare Other | Source: Ambulatory Visit | Attending: Orthopaedic Surgery | Admitting: Orthopaedic Surgery

## 2023-12-02 NOTE — PT Treatment (Signed)
[facility] Medicine 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  Reports it is all over soreness including her back.  States she does have arthritis and takes medicine for it.  Pt notes doing exercise at home as advised.      Objective:  Activities as noted below.     Measured ROM:        EXERCISE/ACTIVITY NAME REPETITIONS RESISTANCE COMPLETED THIS DOS   Seated elliptical     7 min R4 y   Incline gastroc stretch 3'   n   Step stretch flexion  Extension   20 sec x 5 ea   20 sec x 5 ea    N  N    Prone quad stretch  Prone HS curl 3'/3'     2 x 10 each   N      N   Bridging  Gluteal bridge  Bridge on ball X15  X 15  X 15   N  N  n   SLR X10 ea  3#  n   End range PROM  10'   n   MFR B quads 5'   n   SAQ 2 x 10(B)  3# N    LAQ 2 x 10(B)  3# y   Heel slides supine X10(B)   n   Cold pack  10 min    n   Step up and overs X 10 ea No UE Y   Leg press  Hip abduction machine 2 x 10, 2 x 15  3 x 10 110#/50#  40# Y  y   STS  Staggered stance STS 2 x 10     10 each Various hts.  Y     Y    HS stretching 2'/2'   y        Assessment:  Pt tolerated treatment well.  She has satisfactory ROM and improving strength.  Still struggles with sit-stand activities but this is improving.     Short Term Goals: 4 Weeks               -B knee ROM 0 -120 to improve transfers, dressing, sleeping. (MET  11/22/2023)               -Strength of B LE WFL for supine SLR without quad lag. (MET 10/28/2023)               -Patient will be independent in HEP (MET 10/28/2023)                -Pt will demo normal gait with LRAD at least 500' without increasing pain. (MET 11/22/2023)        Long Term Goals: 8 Weeks  -B LE  strength will improve to at least 4+/5 to improve standing, walking, squatting, and stair navigation. (PROGRESSING 11/22/2023)  -Pt will report no pain at rest and less than 4/10 pain with ADLs. (PROGRESSING 11/22/2023)               -Sleep not disrupted by LE pain. (MET 10/28/2023)  -LE strength WFL for patient to negotiate steps reciprocally without rail. (PROGRESSING 11/22/2023)               -Pt will score at least 5 on PSFS to progress towards pt stated goals and demo improved IND with gait and ADLs (MET 10/28/2023)       Plan:   Will continue and progress as tolerated     Total Session Time 40 and Timed code minutes 40  THERAPEUTIC EXERCISE 40 minutes      Zaevion Parke, PTA  12/02/2023, 14:46

## 2023-12-07 ENCOUNTER — Ambulatory Visit (HOSPITAL_COMMUNITY): Payer: Self-pay

## 2023-12-09 ENCOUNTER — Ambulatory Visit (HOSPITAL_COMMUNITY): Payer: Self-pay

## 2024-11-22 ENCOUNTER — Other Ambulatory Visit (HOSPITAL_COMMUNITY): Payer: Self-pay

## 2024-11-22 DIAGNOSIS — Z1231 Encounter for screening mammogram for malignant neoplasm of breast: Secondary | ICD-10-CM

## 2024-11-22 DIAGNOSIS — M810 Age-related osteoporosis without current pathological fracture: Secondary | ICD-10-CM

## 2024-11-28 ENCOUNTER — Encounter (HOSPITAL_COMMUNITY): Payer: Self-pay

## 2024-11-28 ENCOUNTER — Other Ambulatory Visit: Payer: Self-pay

## 2024-11-28 ENCOUNTER — Ambulatory Visit
Admission: RE | Admit: 2024-11-28 | Discharge: 2024-11-28 | Disposition: A | Discharge status: Home / Self Care | Source: Ambulatory Visit

## 2024-11-28 DIAGNOSIS — Z1231 Encounter for screening mammogram for malignant neoplasm of breast: Secondary | ICD-10-CM | POA: Insufficient documentation
